# Patient Record
Sex: Female | Born: 1975 | Race: White | Hispanic: No | Smoking: Never smoker
Health system: Southern US, Community
[De-identification: ages and names within clinical notes are randomized; demographics above are authoritative.]

## PROBLEM LIST (undated history)

## (undated) DIAGNOSIS — D649 Anemia, unspecified: Secondary | ICD-10-CM

## (undated) DIAGNOSIS — F32A Depression, unspecified: Secondary | ICD-10-CM

## (undated) DIAGNOSIS — F329 Major depressive disorder, single episode, unspecified: Secondary | ICD-10-CM

## (undated) DIAGNOSIS — J45909 Unspecified asthma, uncomplicated: Secondary | ICD-10-CM

## (undated) HISTORY — PX: WISDOM TOOTH EXTRACTION: SHX21

## (undated) HISTORY — DX: Major depressive disorder, single episode, unspecified: F32.9

## (undated) HISTORY — DX: Anemia, unspecified: D64.9

## (undated) HISTORY — DX: Depression, unspecified: F32.A

## (undated) NOTE — *Deleted (*Deleted)
OBSTETRIC ADMISSION HISTORY AND PHYSICAL  [name] is a 65 y.o. female [phone] with IUP at [redacted]w[redacted]d by *** presenting for ***. She reports +FMs, No LOF, no VB, no blurry vision, headaches or peripheral edema, and RUQ pain.  She plans on *** feeding. She request *** for birth control. She received her prenatal care at {Blank single:19197::"[facility]"}   Dating: By *** --->  Estimated Date of Delivery: 04/18/20  Sono:    @***w***d, CWD, normal anatomy, *** presentation, *** lie, ***g, ***% EFW   Prenatal History/Complications: ***  Past Medical History: Past Medical History:  Diagnosis Date  . Anemia   . Asthma    childhood - no longer issue per pt  . Cholecystitis 2020  . Depression     Past Surgical History: Past Surgical History:  Procedure Laterality Date  . CHOLECYSTECTOMY N/A 10/27/2018   Procedure: LAPAROSCOPIC CHOLECYSTECTOMY;  Surgeon: Harriette Bouillon, MD;  Location: [facility];  Service: General;  Laterality: N/A;  . WISDOM TOOTH EXTRACTION      Obstetrical History: OB History    Gravida  6   Para  3   Term  3   Preterm      AB  2   Living  3     SAB      TAB  2   Ectopic      Multiple      Live Births              Social History Social History   Socioeconomic History  . Marital status: Legally Separated    Spouse name: Not on file  . Number of children: 3  . Years of education: Not on file  . Highest education level: Not on file  Occupational History  . Not on file  Tobacco Use  . Smoking status: Former Games developer  . Smokeless tobacco: Never Used  . Tobacco comment: marijuana last week  Vaping Use  . Vaping Use: Never used  Substance and Sexual Activity  . Alcohol use: Not Currently    Comment: occcasional  . Drug use: Yes    Types: Marijuana    Comment: discontinued 1 week ago  . Sexual activity: Not on file  Other Topics Concern  . Not on file  Social History Narrative  . Not on file   Social  Determinants of Health   Financial Resource Strain:   . Difficulty of Paying Living Expenses: Not on file  Food Insecurity:   . Worried About Programme researcher, broadcasting/film/video in the Last Year: Not on file  . Ran Out of Food in the Last Year: Not on file  Transportation Needs:   . Lack of Transportation (Medical): Not on file  . Lack of Transportation (Non-Medical): Not on file  Physical Activity:   . Days of Exercise per Week: Not on file  . Minutes of Exercise per Session: Not on file  Stress:   . Feeling of Stress : Not on file  Social Connections:   . Frequency of Communication with Friends and Family: Not on file  . Frequency of Social Gatherings with Friends and Family: Not on file  . Attends Religious Services: Not on file  . Active Member of Clubs or Organizations: Not on file  . Attends Banker Meetings: Not on file  . Marital Status: Not on file    Family History: Family History  Problem Relation Age of Onset  . Diabetes Maternal Grandmother   . Diabetes Paternal Grandfather   .  Hyperthyroidism Mother     Allergies: No Known Allergies  Medications Prior to Admission  Medication Sig Dispense Refill Last Dose  . acetaminophen (TYLENOL) 500 MG tablet Take 1,000 mg by mouth every 6 (six) hours as needed for moderate pain or headache.     . doxylamine, Sleep, (UNISOM) 25 MG tablet Take 25 mg by mouth at bedtime as needed for sleep.     . famotidine (ZANTAC 360) 10 MG tablet Take 10 mg by mouth daily as needed for heartburn or indigestion.     . FEROSUL 325 (65 Fe) MG tablet Take 325 mg by mouth 2 (two) times daily.     . Homeopathic Products (ZICAM ALLERGY RELIEF NA) Place 1 spray into the nose daily as needed (allergies).     . Prenatal Vit-Fe Fumarate-FA (WESTAB PLUS) 27-1 MG TABS Take 1 tablet by mouth daily.        Review of Systems   All systems reviewed and negative except as stated in HPI  Resp. rate 18, last menstrual period 07/22/2019. General  appearance: {general exam:16600} Lungs: clear to auscultation bilaterally Heart: regular rate and rhythm Abdomen: soft, non-tender; bowel sounds normal Pelvic: *** Extremities: Homans sign is negative, no sign of DVT DTR's *** Presentation: {desc; fetal presentation:14558} Fetal monitoring{findings; monitor fetal heart monitor:31527} Uterine activity{Uterine contractions:31516} Dilation: 1.5 Effacement (%): Thick Station: Ballotable Exam by:: Dr. Alvester Morin   Prenatal labs: ABO, Rh: O/Positive/-- (06/28 0000) Antibody: Negative (06/28 0000) Rubella: Immune (06/28 0000) RPR: Nonreactive (06/28 0000)  HBsAg: Negative (06/28 0000)  HIV: Non-reactive (06/28 0000)  GBS: Negative/-- (11/03 0000)  1 hr Glucola *** Genetic screening  *** Anatomy US ***  Prenatal Transfer Tool  Maternal Diabetes: {Maternal Diabetes:3043596} Genetic Screening: {Genetic Screening:20205} Maternal Ultrasounds/Referrals: {Maternal Ultrasounds / Referrals:20211} Fetal Ultrasounds or other Referrals:  {Fetal Ultrasounds or Other Referrals:20213} Maternal Substance Abuse:  {Maternal Substance Abuse:20223} Significant Maternal Medications:  {Significant Maternal Meds:20233} Significant Maternal Lab Results: {Significant Maternal Lab Results:20235}  No results found for this or any previous visit (from the past 24 hour(s)).  There are no problems to display for this patient.   Assessment/Plan:  [name] is a 41 y.o. [id number] at [redacted]w[redacted]d here for***  #Labor:*** #Pain: *** #FWB: *** #ID:  *** #MOF: *** #MOC:*** #Circ:  ***  Sheila Oats, MD  04/11/2020, 12:21 PM

---

## 2001-04-04 ENCOUNTER — Inpatient Hospital Stay (HOSPITAL_COMMUNITY): Admission: AD | Admit: 2001-04-04 | Discharge: 2001-04-04 | Payer: Self-pay | Admitting: Obstetrics & Gynecology

## 2001-04-10 ENCOUNTER — Other Ambulatory Visit: Admission: RE | Admit: 2001-04-10 | Discharge: 2001-04-10 | Payer: Self-pay | Admitting: *Deleted

## 2001-07-29 ENCOUNTER — Encounter: Payer: Self-pay | Admitting: *Deleted

## 2001-07-29 ENCOUNTER — Inpatient Hospital Stay (HOSPITAL_COMMUNITY): Admission: RE | Admit: 2001-07-29 | Discharge: 2001-07-29 | Payer: Self-pay | Admitting: *Deleted

## 2001-07-30 ENCOUNTER — Inpatient Hospital Stay (HOSPITAL_COMMUNITY): Admission: AD | Admit: 2001-07-30 | Discharge: 2001-08-01 | Payer: Self-pay

## 2002-04-05 ENCOUNTER — Emergency Department (HOSPITAL_COMMUNITY): Admission: EM | Admit: 2002-04-05 | Discharge: 2002-04-05 | Payer: Self-pay | Admitting: Emergency Medicine

## 2002-04-27 ENCOUNTER — Emergency Department (HOSPITAL_COMMUNITY): Admission: EM | Admit: 2002-04-27 | Discharge: 2002-04-27 | Payer: Self-pay | Admitting: Emergency Medicine

## 2002-05-07 ENCOUNTER — Emergency Department (HOSPITAL_COMMUNITY): Admission: EM | Admit: 2002-05-07 | Discharge: 2002-05-07 | Payer: Self-pay | Admitting: Emergency Medicine

## 2003-04-29 ENCOUNTER — Emergency Department (HOSPITAL_COMMUNITY): Admission: EM | Admit: 2003-04-29 | Discharge: 2003-04-29 | Payer: Self-pay | Admitting: Emergency Medicine

## 2003-07-11 ENCOUNTER — Ambulatory Visit (HOSPITAL_COMMUNITY): Admission: RE | Admit: 2003-07-11 | Discharge: 2003-07-11 | Payer: Self-pay | Admitting: Obstetrics & Gynecology

## 2003-08-24 ENCOUNTER — Inpatient Hospital Stay (HOSPITAL_COMMUNITY): Admission: AD | Admit: 2003-08-24 | Discharge: 2003-08-25 | Payer: Self-pay | Admitting: Obstetrics

## 2003-12-23 ENCOUNTER — Inpatient Hospital Stay (HOSPITAL_COMMUNITY): Admission: AD | Admit: 2003-12-23 | Discharge: 2003-12-27 | Payer: Self-pay | Admitting: Obstetrics & Gynecology

## 2003-12-26 ENCOUNTER — Encounter (INDEPENDENT_AMBULATORY_CARE_PROVIDER_SITE_OTHER): Payer: Self-pay | Admitting: *Deleted

## 2004-07-13 ENCOUNTER — Emergency Department (HOSPITAL_COMMUNITY): Admission: EM | Admit: 2004-07-13 | Discharge: 2004-07-13 | Payer: Self-pay | Admitting: Emergency Medicine

## 2008-10-16 ENCOUNTER — Emergency Department (HOSPITAL_COMMUNITY): Admission: EM | Admit: 2008-10-16 | Discharge: 2008-10-16 | Payer: Self-pay | Admitting: Emergency Medicine

## 2010-06-09 ENCOUNTER — Encounter: Payer: Self-pay | Admitting: Obstetrics & Gynecology

## 2010-08-28 LAB — URINALYSIS, ROUTINE W REFLEX MICROSCOPIC
Bilirubin Urine: NEGATIVE
Glucose, UA: NEGATIVE mg/dL
Hgb urine dipstick: NEGATIVE
Nitrite: NEGATIVE
Protein, ur: NEGATIVE mg/dL
Specific Gravity, Urine: 1.015 (ref 1.005–1.030)
Urobilinogen, UA: 0.2 mg/dL (ref 0.0–1.0)
pH: 6.5 (ref 5.0–8.0)

## 2010-08-28 LAB — COMPREHENSIVE METABOLIC PANEL
AST: 21 U/L (ref 0–37)
Albumin: 3.7 g/dL (ref 3.5–5.2)
BUN: 11 mg/dL (ref 6–23)
Calcium: 8.8 mg/dL (ref 8.4–10.5)
Creatinine, Ser: 0.5 mg/dL (ref 0.4–1.2)
GFR calc Af Amer: >60 mL/min (ref >60)
Total Protein: 6.4 g/dL (ref 6.0–8.3)

## 2010-08-28 LAB — CBC
HCT: 37.4 % (ref 36.0–46.0)
MCV: 88.5 fL (ref 78.0–100.0)
Platelets: 211 10*3/uL (ref 150–400)
RDW: 12.8 % (ref 11.5–15.5)
WBC: 4.9 10*3/uL (ref 4.0–10.5)

## 2010-08-28 LAB — PREGNANCY, URINE: Preg Test, Ur: NEGATIVE

## 2010-08-28 LAB — DIFFERENTIAL
Basophils Absolute: 0 10*3/uL (ref 0.0–0.1)
Eosinophils Relative: 1 % (ref 0–5)
Lymphocytes Relative: 12 % (ref 12–46)
Lymphs Abs: 0.6 10*3/uL — ABNORMAL LOW (ref 0.7–4.0)
Monocytes Absolute: 0.3 10*3/uL (ref 0.1–1.0)
Monocytes Relative: 7 % (ref 3–12)
Neutro Abs: 3.9 10*3/uL (ref 1.7–7.7)

## 2010-10-02 NOTE — Consult Note (Signed)
[name], [name] NO.:  [id number]   MEDICAL RECORD NO.:  [id number]          PATIENT TYPE:  EMS   LOCATION:  ED                           [facility]ACILITY[facility]  [facility]   PHYSICIAN:  Juanetta Gosling, MDDATE OF BIRTH:  1975/10/17   DATE OF CONSULTATION:  10/16/2008  DATE OF DISCHARGE:                                 CONSULTATION   CHIEF COMPLAINT:  Epigastric pain.   HISTORY OF PRESENT ILLNESS:  A 35 year old female with about a 1 week  history of epigastric pain, mostly occurring after she has eaten. She  does note that this is worse with spicy foods. She has not had this  evaluated before. She has attempted taking some anti-reflux medication,  over-the-counter, with some relief of this but what sounds probably like  a heartburn component as well. She still has the pain present. Last 24  to 48 hours after eating a meal, she has had worse pain causing her some  nausea as well as a couple episodes of emesis as well prior to bringing  her to the emergency room. No fevers at home. No real relieving factors.  Usually the pain will go away but this time persists.   PAST MEDICAL HISTORY:  Negative.   PAST SURGICAL HISTORY:  Negative.   MEDICATIONS:  None.   ALLERGIES:  NO KNOWN DRUG ALLERGIES.   SOCIAL HISTORY:  Occasional alcohol, non-smoker.   PHYSICAL EXAMINATION:  VITAL SIGNS: T-max is 100.2  GENERAL:  Well developed, well nourished female in no acute distress.  NECK:  Supple without adenopathy.  HEART:  Regular rate and rhythm.  LUNGS:  Clear bilaterally.  ABDOMEN:  Soft, nontender, and nondistended. Bowel sounds present with  no Murphy's sign.  EXTREMITIES:  No edema.   LABORATORY DATA:  HCG is negative. Urine shows trace ketones. Lipase 17.  BUN and creatinine are 11 and 0.5. Liver function studies are all within  normal limits. White blood cell count is 4.9. Hematocrit 37.4.   Ultrasound shows multiple gallstones and a 3 mm gallbladder wall. No  pericholecystic fluid and no Murphy's sign. Common bile duct is 5 mm.   ASSESSMENT:  Likely symptomatic cholelithiasis.   PLAN:  She is non-tender currently with examination and ultrasound  evidence of cholecystitis. I will discuss that if she tolerates p.o.'s  that she can go home and I will see her in the office next week.      Juanetta Gosling, MD  Electronically Signed     MC[facility]/MEDQ  D:  10/16/2008  T:  10/16/2008  Job:  [phone]

## 2017-01-24 ENCOUNTER — Other Ambulatory Visit: Payer: Self-pay | Admitting: Family Medicine

## 2017-01-24 DIAGNOSIS — R1084 Generalized abdominal pain: Secondary | ICD-10-CM

## 2017-01-31 DIAGNOSIS — Z23 Encounter for immunization: Secondary | ICD-10-CM | POA: Diagnosis not present

## 2017-01-31 DIAGNOSIS — R109 Unspecified abdominal pain: Secondary | ICD-10-CM | POA: Diagnosis not present

## 2017-02-03 ENCOUNTER — Ambulatory Visit
Admission: RE | Admit: 2017-02-03 | Discharge: 2017-02-03 | Disposition: A | Discharge status: Home / Self Care | Payer: 59 | Source: Ambulatory Visit | Attending: Family Medicine | Admitting: Family Medicine

## 2017-02-03 DIAGNOSIS — K8020 Calculus of gallbladder without cholecystitis without obstruction: Secondary | ICD-10-CM | POA: Diagnosis not present

## 2017-02-03 DIAGNOSIS — R1084 Generalized abdominal pain: Secondary | ICD-10-CM

## 2017-02-07 DIAGNOSIS — R109 Unspecified abdominal pain: Secondary | ICD-10-CM | POA: Diagnosis not present

## 2017-02-07 DIAGNOSIS — K219 Gastro-esophageal reflux disease without esophagitis: Secondary | ICD-10-CM | POA: Diagnosis not present

## 2018-05-20 DIAGNOSIS — K819 Cholecystitis, unspecified: Secondary | ICD-10-CM

## 2018-05-20 HISTORY — DX: Cholecystitis, unspecified: K81.9

## 2018-09-24 ENCOUNTER — Encounter: Payer: Self-pay | Admitting: Gastroenterology

## 2018-09-24 ENCOUNTER — Ambulatory Visit (INDEPENDENT_AMBULATORY_CARE_PROVIDER_SITE_OTHER): Payer: 59 | Admitting: Gastroenterology

## 2018-09-24 ENCOUNTER — Other Ambulatory Visit: Payer: Self-pay

## 2018-09-24 VITALS — Ht 59.0 in | Wt 121.0 lb

## 2018-09-24 DIAGNOSIS — R1011 Right upper quadrant pain: Secondary | ICD-10-CM

## 2018-09-24 DIAGNOSIS — R1013 Epigastric pain: Secondary | ICD-10-CM | POA: Diagnosis not present

## 2018-09-24 DIAGNOSIS — K8020 Calculus of gallbladder without cholecystitis without obstruction: Secondary | ICD-10-CM

## 2018-09-24 MED ORDER — PANTOPRAZOLE SODIUM 40 MG PO TBEC: 40.0000 mg | DELAYED_RELEASE_TABLET | Freq: Every day | ORAL | 11 refills | Status: DC

## 2018-09-24 NOTE — Progress Notes (Addendum)
TELEHEALTH VISIT  Referring Provider: Lewis Moccasinewey, Elizabeth R, MD Primary Care Physician:  Lewis Moccasinewey, Elizabeth R, MD   Tele-visit due to COVID-19 pandemic Patient requested visit virtually, consented to the virtual encounter via video enabled telemedicine application Contact made at: 3:35 09/24/18 Patient verified by name and date of birth Location of patient: Home Location provider: [facility] Names of persons participating: Me, patient, daughter, Deneise LeverDesiree Brown CMA Time spent on telehealth visit: 28 minutes I discussed the limitations of evaluation and management by telemedicine. The patient expressed understanding and agreed to proceed.  Reason for Consultation:  Abdominal pain   IMPRESSION:  Postprandial  epigastric and right upper quadrant abdominal pain    - not responding to ranitidine Cholelithiasis on ultrasound 02/03/17  Stuttering right upper quadrant/epigastric pain is concerning for symptomatic gallbladder disease.  Differential also includes esophagitis, gastritis, H. pylori, and pancreatic disease.  In the event that her symptoms are related to reflux, gastritis or esophagitis I recommended an empiric trial of pantoprazole 40 mg daily until she is seen by a surgeon.  I have asked Ms. Alycia RossettiKoch to follow-up with me after her surgical consultation to determine if additional GI evaluation is indicated.  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  History is obtained to the patient and review of her electronic health record.  She has not previously been evaluated by gastroenterologist.  Intermittent episodes of sharp, stabbing midepigastric and RUQ abdominal pains for several years. Worse recently.  Starts within 2 hours of eating. Triggered by fatty and greasy food. Eating bland food  to minimize the symptoms. Also worsened by coffee and wine.   Associated nausea.  Symptoms can last for the whole night. Achy for another 24 hours. Can last up to 2 weeks.  Exacerbated by stress. No dysphagia or odynophagia. Has lost 20 pounds over the last year. Frequent constipation. No blood in the stool.  Her husband feels that the symptoms are bad enough to call 911.   No response to ranitidine.  She has not tried other medications. No other associated symptoms. No identified exacerbating or relieving features.   Avoids all NSAIDs. No evidence for GI bleeding, iron deficiency anemia, anorexia, unexplained weight loss, dysphagia, odynophagia, persistent vomiting, or gastrointestinal cancer in a first-degree relative.  Abdominal ultrasound 02/03/17 for 3 years of abdominal pain revealed gallstones. Prescribed acid reducer without improvement.   No prior endoscopic evaluation.  No known family history of colon cancer or polyps. No family history of uterine/endometrial cancer, pancreatic cancer or gastric/stomach cancer.  Past Medical History:  Diagnosis Date  . Depression     History reviewed. No pertinent surgical history.  Current Outpatient Medications  Medication Sig Dispense Refill  . Ascorbic Acid (VITAMIN C) 100 MG tablet Take 100 mg by mouth daily.    Marland Kitchen. EVENING PRIMROSE OIL PO Take by mouth daily.    . Multiple Vitamin (MULTIVITAMIN) tablet Take 1 tablet by mouth daily.    . Vitamin D, Ergocalciferol, (DRISDOL) 1.25 MG (50000 UT) CAPS capsule Take 50,000 Units by mouth every 7 (seven) days.     No current facility-administered medications for this visit.     Allergies as of 09/24/2018  . (No Known Allergies)    Family History  Problem Relation Age of Onset  . Diabetes Maternal Grandmother   . Diabetes Paternal Grandfather  Social History   Socioeconomic History  . Marital status: Legally Separated    Spouse name: Not on file  . Number of children: Not on file   . Years of education: Not on file  . Highest education level: Not on file  Occupational History  . Not on file  Social Needs  . Financial resource strain: Not on file  . Food insecurity:    Worry: Not on file    Inability: Not on file  . Transportation needs:    Medical: Not on file    Non-medical: Not on file  Tobacco Use  . Smoking status: Never Smoker  . Smokeless tobacco: Never Used  Substance and Sexual Activity  . Alcohol use: Never    Frequency: Never  . Drug use: Yes    Types: Marijuana  . Sexual activity: Not on file  Lifestyle  . Physical activity:    Days per week: Not on file    Minutes per session: Not on file  . Stress: Not on file  Relationships  . Social connections:    Talks on phone: Not on file    Gets together: Not on file    Attends religious service: Not on file    Active member of club or organization: Not on file    Attends meetings of clubs or organizations: Not on file    Relationship status: Not on file  . Intimate partner violence:    Fear of current or ex partner: Not on file    Emotionally abused: Not on file    Physically abused: Not on file    Forced sexual activity: Not on file  Other Topics Concern  . Not on file  Social History Narrative  . Not on file    Review of Systems: ALL ROS discussed and all others negative except listed in HPI.  Physical Exam: General: in no acute distress Neuro: Alert and appropriate Psych: Normal affect and normal insight   Cherrish Vitali L. Orvan Falconer, MD, MPH [facility] 09/24/2018, 3:25 PM

## 2018-09-24 NOTE — Patient Instructions (Signed)
Do not take any more ranitidine! The FDA released a warning about the detection of low levels of NDMA, a substance associated with cancer,  in ranitidine medicines.  In place of ranitidine, please start taking pantoprazole 40 mg daily. Take this every morning 30-60 minutes before bedtime. Take it every day between now and your surgery consultation.  I am referring you to surgery to see if she or he thinks removing your gallbladder may improve your symptoms.  Please call me after your consultation.  Thank you for your patience with me and our technology today! Please stay home, safe, and healthy. I look forward to meeting you in person in the future.

## 2018-10-09 ENCOUNTER — Ambulatory Visit: Payer: Self-pay | Admitting: Surgery

## 2018-10-09 DIAGNOSIS — K8020 Calculus of gallbladder without cholecystitis without obstruction: Secondary | ICD-10-CM | POA: Diagnosis not present

## 2018-10-09 NOTE — H&P (Signed)
[name] Documented: 10/09/2018 9:56 AM Location: [facility] Patient #: [id number] DOB: 1975-10-19 Single / Language: [place] / Race: White Female  History of Present Illness Maisie Fus A. Sya Nestler MD; 10/09/2018 11:03 AM) Patient words: Patient sent at the request of Dr. Orvan Falconer with a 3 year history of epigastric abdominal pain after eating. This is been constant for over 3 years. She underwent ultrasound in 2018 which showed gallstones. Her pain is more severe after eating fatty greasy meals. She does have some mild radiation to her back during these episodes which last for minutes to hours. This occurs 2 hours after eating heavy greasy fried foods or alcohol. Location of pain is in her epigastrium. It is sharp in nature with associated nausea.  The patient is a 43 year old female.   Past Surgical History (Tanisha A. Manson Passey, RMA; 10/09/2018 9:57 AM) No pertinent past surgical history  Diagnostic Studies History (Tanisha A. Manson Passey, RMA; 10/09/2018 9:57 AM) Colonoscopy never Mammogram never Pap Smear >5 years ago  Allergies (Tanisha A. Manson Passey, RMA; 10/09/2018 9:57 AM) No Known Drug Allergies [10/09/2018]: Allergies Reconciled  Medication History (Tanisha A. Manson Passey, RMA; 10/09/2018 9:57 AM) Pantoprazole Sodium (40MG  Tablet DR, Oral) Active. Medications Reconciled  Social History (Tanisha A. Manson Passey, RMA; 10/09/2018 9:57 AM) Alcohol use Moderate alcohol use. Caffeine use Carbonated beverages, Coffee, Tea. Illicit drug use Prefer to discuss with provider. Tobacco use Never smoker.  Family History (Tanisha A. Manson Passey, RMA; 10/09/2018 9:57 AM) Diabetes Mellitus Family Members In General.  Pregnancy / Birth History (Tanisha A. Manson Passey, RMA; 10/09/2018 9:57 AM) Age at menarche 15 years. Gravida 4 Maternal age 14-20 Para 3 Regular periods  Other Problems (Tanisha A. Manson Passey, RMA; 10/09/2018 9:57 AM) Cholelithiasis Sleep Apnea     Review of Systems (Aqil Goetting A.  Carleta Woodrow MD; 10/09/2018 11:03 AM) General Not Present- Appetite Loss, Chills, Fatigue, Fever, Night Sweats, Weight Gain and Weight Loss. HEENT Not Present- Earache, Hearing Loss, Hoarseness, Nose Bleed, Oral Ulcers, Ringing in the Ears, Seasonal Allergies, Sinus Pain, Sore Throat, Visual Disturbances, Wears glasses/contact lenses and Yellow Eyes. Female Genitourinary Not Present- Frequency, Nocturia, Painful Urination, Pelvic Pain and Urgency. All other systems negative  Vitals (Tanisha A. Brown RMA; 10/09/2018 9:57 AM) 10/09/2018 9:57 AM Weight: 121.8 lb Height: 59in Body Surface Area: 1.49 m Body Mass Index: 24.6 kg/m  Temp.: 98.77F  Pulse: 85 (Regular)  BP: 112/74 (Sitting, Left Arm, Standard)      Physical Exam (Georgio Hattabaugh A. Antonela Freiman MD; 10/09/2018 11:03 AM)  General Mental Status-Alert. General Appearance-Consistent with stated age. Hydration-Well hydrated. Voice-Normal.  Head and Neck Head-normocephalic, atraumatic with no lesions or palpable masses.  Eye Eyeball - Bilateral-Extraocular movements intact. Sclera/Conjunctiva - Bilateral-No scleral icterus.  Chest and Lung Exam Chest and lung exam reveals -quiet, even and easy respiratory effort with no use of accessory muscles and on auscultation, normal breath sounds, no adventitious sounds and normal vocal resonance. Inspection Chest Wall - Normal. Back - normal.  Cardiovascular Cardiovascular examination reveals -on palpation PMI is normal in location and amplitude, no palpable S3 or S4. Normal cardiac borders., normal heart sounds, regular rate and rhythm with no murmurs, carotid auscultation reveals no bruits and normal pedal pulses bilaterally.  Abdomen Inspection Inspection of the abdomen reveals - No Hernias. Skin - Scar - no surgical scars. Palpation/Percussion Palpation and Percussion of the abdomen reveal - Soft, Non Tender, No Rebound tenderness, No Rigidity (guarding) and No  hepatosplenomegaly. Auscultation Auscultation of the abdomen reveals - Bowel sounds normal.  Neurologic  Neurologic evaluation reveals -alert and oriented x 3 with no impairment of recent or remote memory. Mental Status-Normal.  Musculoskeletal Normal Exam - Left-Upper Extremity Strength Normal and Lower Extremity Strength Normal. Normal Exam - Right-Upper Extremity Strength Normal, Lower Extremity Weakness.    Assessment & Plan (Monroe Qin A. Juliet Vasbinder MD; 10/09/2018 11:03 AM)  SYMPTOMATIC CHOLELITHIASIS (K80.20) Impression: Repeat ultrasound and LFTs. Recommend laparoscopic cholecystectomy for long-standing biliary colic. The procedure has been discussed with the patient. Risks of laparoscopic cholecystectomy include bleeding, infection, bile duct injury, leak, death, open surgery, diarrhea, other surgery, organ injury, blood vessel injury, DVT, and additional care.  Current Plans You are being scheduled for surgery- Our schedulers will call you.  You should hear from our office's scheduling department within 5 working days about the location, date, and time of surgery. We try to make accommodations for patient's preferences in scheduling surgery, but sometimes the OR schedule or the surgeon's schedule prevents us from making those accommodations.  If you have not heard from our office ([phone]) in 5 working days, call the office and ask for your surgeon's nurse.  If you have other questions about your diagnosis, plan, or surgery, call the office and ask for your surgeon's nurse.  Pt Education - Pamphlet Given - Laparoscopic Gallbladder Surgery: discussed with patient and provided information. The anatomy & physiology of hepatobiliary & pancreatic function was discussed. The pathophysiology of gallbladder dysfunction was discussed. Natural history risks without surgery was discussed. I feel the risks of no intervention will lead to serious problems that outweigh the  operative risks; therefore, I recommended cholecystectomy to remove the pathology. I explained laparoscopic techniques with possible need for an open approach. Probable cholangiogram to evaluate the bilary tract was explained as well.  Risks such as bleeding, infection, abscess, leak, injury to other organs, need for further treatment, heart attack, death, and other risks were discussed. I noted a good likelihood this will help address the problem. Possibility that this will not correct all abdominal symptoms was explained. Goals of post-operative recovery were discussed as well. We will work to minimize complications. An educational handout further explaining the pathology and treatment options was given as well. Questions were answered. The patient expresses understanding & wishes to proceed with surgery.  Pt Education - [facility] Laparosopic Post Op HCI (Gross) Pt Education - [facility] Good Bowel Health (Gross) Pt Education - Laparoscopic Cholecystectomy: gallbladde 

## 2018-10-09 NOTE — H&P (View-Only) (Signed)
[name] Documented: 10/09/2018 9:56 AM Location: [facility] Patient #: [id number] DOB: 1975-10-19 Single / Language: [place] / Race: White Female  History of Present Illness Mallory Fus A. Magnus Crescenzo MD; 10/09/2018 11:03 AM) Patient words: Patient sent at the request of Dr. Orvan Falconer with a 3 year history of epigastric abdominal pain after eating. This is been constant for over 3 years. She underwent ultrasound in 2018 which showed gallstones. Her pain is more severe after eating fatty greasy meals. She does have some mild radiation to her back during these episodes which last for minutes to hours. This occurs 2 hours after eating heavy greasy fried foods or alcohol. Location of pain is in her epigastrium. It is sharp in nature with associated nausea.  The patient is a 43 year old female.   Past Surgical History (Tanisha A. Manson Passey, RMA; 10/09/2018 9:57 AM) No pertinent past surgical history  Diagnostic Studies History (Tanisha A. Manson Passey, RMA; 10/09/2018 9:57 AM) Colonoscopy never Mammogram never Pap Smear >5 years ago  Allergies (Tanisha A. Manson Passey, RMA; 10/09/2018 9:57 AM) No Known Drug Allergies [10/09/2018]: Allergies Reconciled  Medication History (Tanisha A. Manson Passey, RMA; 10/09/2018 9:57 AM) Pantoprazole Sodium (40MG  Tablet DR, Oral) Active. Medications Reconciled  Social History (Tanisha A. Manson Passey, RMA; 10/09/2018 9:57 AM) Alcohol use Moderate alcohol use. Caffeine use Carbonated beverages, Coffee, Tea. Illicit drug use Prefer to discuss with provider. Tobacco use Never smoker.  Family History (Tanisha A. Manson Passey, RMA; 10/09/2018 9:57 AM) Diabetes Mellitus Family Members In General.  Pregnancy / Birth History (Tanisha A. Manson Passey, RMA; 10/09/2018 9:57 AM) Age at menarche 15 years. Gravida 4 Maternal age 14-20 Para 3 Regular periods  Other Problems (Tanisha A. Manson Passey, RMA; 10/09/2018 9:57 AM) Cholelithiasis Sleep Apnea     Review of Systems (Alexes Lamarque A.  Wyley Hack MD; 10/09/2018 11:03 AM) General Not Present- Appetite Loss, Chills, Fatigue, Fever, Night Sweats, Weight Gain and Weight Loss. HEENT Not Present- Earache, Hearing Loss, Hoarseness, Nose Bleed, Oral Ulcers, Ringing in the Ears, Seasonal Allergies, Sinus Pain, Sore Throat, Visual Disturbances, Wears glasses/contact lenses and Yellow Eyes. Female Genitourinary Not Present- Frequency, Nocturia, Painful Urination, Pelvic Pain and Urgency. All other systems negative  Vitals (Tanisha A. Brown RMA; 10/09/2018 9:57 AM) 10/09/2018 9:57 AM Weight: 121.8 lb Height: 59in Body Surface Area: 1.49 m Body Mass Index: 24.6 kg/m  Temp.: 98.77F  Pulse: 85 (Regular)  BP: 112/74 (Sitting, Left Arm, Standard)      Physical Exam (Sadiya Durand A. Johnpatrick Jenny MD; 10/09/2018 11:03 AM)  General Mental Status-Alert. General Appearance-Consistent with stated age. Hydration-Well hydrated. Voice-Normal.  Head and Neck Head-normocephalic, atraumatic with no lesions or palpable masses.  Eye Eyeball - Bilateral-Extraocular movements intact. Sclera/Conjunctiva - Bilateral-No scleral icterus.  Chest and Lung Exam Chest and lung exam reveals -quiet, even and easy respiratory effort with no use of accessory muscles and on auscultation, normal breath sounds, no adventitious sounds and normal vocal resonance. Inspection Chest Wall - Normal. Back - normal.  Cardiovascular Cardiovascular examination reveals -on palpation PMI is normal in location and amplitude, no palpable S3 or S4. Normal cardiac borders., normal heart sounds, regular rate and rhythm with no murmurs, carotid auscultation reveals no bruits and normal pedal pulses bilaterally.  Abdomen Inspection Inspection of the abdomen reveals - No Hernias. Skin - Scar - no surgical scars. Palpation/Percussion Palpation and Percussion of the abdomen reveal - Soft, Non Tender, No Rebound tenderness, No Rigidity (guarding) and No  hepatosplenomegaly. Auscultation Auscultation of the abdomen reveals - Bowel sounds normal.  Neurologic  Neurologic evaluation reveals -alert and oriented x 3 with no impairment of recent or remote memory. Mental Status-Normal.  Musculoskeletal Normal Exam - Left-Upper Extremity Strength Normal and Lower Extremity Strength Normal. Normal Exam - Right-Upper Extremity Strength Normal, Lower Extremity Weakness.    Assessment & Plan (Laurella Tull A. Christapher Gillian MD; 10/09/2018 11:03 AM)  SYMPTOMATIC CHOLELITHIASIS (K80.20) Impression: Repeat ultrasound and LFTs. Recommend laparoscopic cholecystectomy for long-standing biliary colic. The procedure has been discussed with the patient. Risks of laparoscopic cholecystectomy include bleeding, infection, bile duct injury, leak, death, open surgery, diarrhea, other surgery, organ injury, blood vessel injury, DVT, and additional care.  Current Plans You are being scheduled for surgery- Our schedulers will call you.  You should hear from our office's scheduling department within 5 working days about the location, date, and time of surgery. We try to make accommodations for patient's preferences in scheduling surgery, but sometimes the OR schedule or the surgeon's schedule prevents us from making those accommodations.  If you have not heard from our office [phone]) in 5 working days, call the office and ask for your surgeon's nurse.  If you have other questions about your diagnosis, plan, or surgery, call the office and ask for your surgeon's nurse.  Pt Education - Pamphlet Given - Laparoscopic Gallbladder Surgery: discussed with patient and provided information. The anatomy & physiology of hepatobiliary & pancreatic function was discussed. The pathophysiology of gallbladder dysfunction was discussed. Natural history risks without surgery was discussed. I feel the risks of no intervention will lead to serious problems that outweigh the  operative risks; therefore, I recommended cholecystectomy to remove the pathology. I explained laparoscopic techniques with possible need for an open approach. Probable cholangiogram to evaluate the bilary tract was explained as well.  Risks such as bleeding, infection, abscess, leak, injury to other organs, need for further treatment, heart attack, death, and other risks were discussed. I noted a good likelihood this will help address the problem. Possibility that this will not correct all abdominal symptoms was explained. Goals of post-operative recovery were discussed as well. We will work to minimize complications. An educational handout further explaining the pathology and treatment options was given as well. Questions were answered. The patient expresses understanding & wishes to proceed with surgery.  Pt Education - [facility] Laparosopic Post Op HCI (Gross) Pt Education - [facility] Good Bowel Health (Gross) Pt Education - Laparoscopic Cholecystectomy: gallbladde

## 2018-10-16 ENCOUNTER — Other Ambulatory Visit: Payer: Self-pay | Admitting: Surgery

## 2018-10-16 DIAGNOSIS — K8020 Calculus of gallbladder without cholecystitis without obstruction: Secondary | ICD-10-CM

## 2018-10-23 NOTE — Pre-Procedure Instructions (Signed)
[name]  10/23/2018      [id number] [place], [place] - [address] AT [facility] OF [facility] & [address] [place] [address] Phone: [phone] Fax: [phone]    Your procedure is scheduled on June 9  Report to [facility] Entrance A at 1200  (noon)  Call this number if you have problems the morning of surgery:  [phone]   Remember:  Do not eat  after midnight.   You may drink clear liquids until 11:00 A.M..  Clear liquids allowed are:                    Water, Juice (non-citric and without pulp), Carbonated beverages, Clear Tea, Black Coffee only, Plain Jell-O only, Gatorade and Plain Popsicles only    Take these medicines the morning of surgery with A SIP OF WATER :             Pantoprazole (protonix)                 7 days prior to surgery STOP taking any Aspirin (unless otherwise instructed by your surgeon), Aleve, Naproxen, Ibuprofen, Motrin, Advil, Goody's, BC's, all herbal medications, fish oil, and all vitamins.    Do not wear jewelry, make-up or nail polish.  Do not wear lotions, powders, or perfumes, or deodorant.  Do not shave 48 hours prior to surgery.  Men may shave face and neck.  Do not bring valuables to the hospital.  [facility] is not responsible for any belongings or valuables.  Contacts, dentures or bridgework may not be worn into surgery.  Leave your suitcase in the car.  After surgery it may be brought to your room.  For patients admitted to the hospital, discharge time will be determined by your treatment team.  Patients discharged the day of surgery will not be allowed to drive home.    Special instructions:   [place] [place]- Preparing For Surgery  Before surgery, you can play an important role. Because skin is not sterile, your skin needs to be as free of germs as possible. You can reduce the number of germs on your skin by washing with CHG (chlorahexidine gluconate) Soap before surgery.   CHG is an antiseptic cleaner which kills germs and bonds with the skin to continue killing germs even after washing.    Oral Hygiene is also important to reduce your risk of infection.  Remember - BRUSH YOUR TEETH THE MORNING OF SURGERY WITH YOUR REGULAR TOOTHPASTE  Please do not use if you have an allergy to CHG or antibacterial soaps. If your skin becomes reddened/irritated stop using the CHG.  Do not shave (including legs and underarms) for at least 48 hours prior to first CHG shower. It is OK to shave your face.  Please follow these instructions carefully.   1. Shower the NIGHT BEFORE SURGERY and the MORNING OF SURGERY with CHG.   2. If you chose to wash your hair, wash your hair first as usual with your normal shampoo.  3. After you shampoo, rinse your hair and body thoroughly to remove the shampoo.  4. Use CHG as you would any other liquid soap. You can apply CHG directly to the skin and wash gently with a scrungie or a clean washcloth.   5. Apply the CHG Soap to your body ONLY FROM THE NECK DOWN.  Do not use on open wounds or open sores.  Avoid contact with your eyes, ears, mouth and genitals (private parts). Wash Face and genitals (private parts)  with your normal soap.  6. Wash thoroughly, paying special attention to the area where your surgery will be performed.  7. Thoroughly rinse your body with warm water from the neck down.  8. DO NOT shower/wash with your normal soap after using and rinsing off the CHG Soap.  9. Pat yourself dry with a CLEAN TOWEL.  10. Wear CLEAN PAJAMAS to bed the night before surgery, wear comfortable clothes the morning of surgery  11. Place CLEAN SHEETS on your bed the night of your first shower and DO NOT SLEEP WITH PETS.    Day of Surgery:  Do not apply any deodorants/lotions.  Please wear clean clothes to the hospital/surgery center.   Remember to brush your teeth WITH YOUR REGULAR TOOTHPASTE.    Please read over the following fact  sheets that you were given. Coughing and Deep Breathing and Surgical Site Infection Prevention

## 2018-10-26 ENCOUNTER — Encounter (HOSPITAL_COMMUNITY): Payer: Self-pay

## 2018-10-26 ENCOUNTER — Other Ambulatory Visit: Payer: Self-pay

## 2018-10-26 ENCOUNTER — Encounter (HOSPITAL_COMMUNITY)
Admission: RE | Admit: 2018-10-26 | Discharge: 2018-10-26 | Disposition: A | Discharge status: Home / Self Care | Payer: 59 | Source: Ambulatory Visit | Attending: Surgery | Admitting: Surgery

## 2018-10-26 ENCOUNTER — Other Ambulatory Visit (HOSPITAL_COMMUNITY)
Admission: RE | Admit: 2018-10-26 | Discharge: 2018-10-26 | Disposition: A | Discharge status: Home / Self Care | Payer: 59 | Source: Ambulatory Visit | Attending: Surgery | Admitting: Surgery

## 2018-10-26 DIAGNOSIS — K8020 Calculus of gallbladder without cholecystitis without obstruction: Secondary | ICD-10-CM | POA: Diagnosis present

## 2018-10-26 DIAGNOSIS — K8010 Calculus of gallbladder with chronic cholecystitis without obstruction: Secondary | ICD-10-CM | POA: Diagnosis not present

## 2018-10-26 DIAGNOSIS — Z01812 Encounter for preprocedural laboratory examination: Secondary | ICD-10-CM | POA: Insufficient documentation

## 2018-10-26 DIAGNOSIS — K219 Gastro-esophageal reflux disease without esophagitis: Secondary | ICD-10-CM | POA: Diagnosis not present

## 2018-10-26 DIAGNOSIS — Z1159 Encounter for screening for other viral diseases: Secondary | ICD-10-CM | POA: Insufficient documentation

## 2018-10-26 LAB — CBC WITH DIFFERENTIAL/PLATELET
Abs Immature Granulocytes: 0.01 10*3/uL (ref 0.00–0.07)
Basophils Absolute: 0 10*3/uL (ref 0.0–0.1)
Basophils Relative: 1 %
Eosinophils Absolute: 0.1 10*3/uL (ref 0.0–0.5)
Eosinophils Relative: 3 %
HCT: 37.2 % (ref 36.0–46.0)
Hemoglobin: 12.1 g/dL (ref 12.0–15.0)
Immature Granulocytes: 0 %
Lymphocytes Relative: 34 %
Lymphs Abs: 1.8 10*3/uL (ref 0.7–4.0)
MCH: 30.9 pg (ref 26.0–34.0)
MCHC: 32.5 g/dL (ref 30.0–36.0)
MCV: 94.9 fL (ref 80.0–100.0)
Monocytes Absolute: 0.6 10*3/uL (ref 0.1–1.0)
Monocytes Relative: 10 %
Neutro Abs: 2.8 10*3/uL (ref 1.7–7.7)
Neutrophils Relative %: 52 %
Platelets: 312 10*3/uL (ref 150–400)
RBC: 3.92 MIL/uL (ref 3.87–5.11)
RDW: 12.7 % (ref 11.5–15.5)
WBC: 5.3 10*3/uL (ref 4.0–10.5)
nRBC: 0 % (ref 0.0–0.2)

## 2018-10-26 LAB — COMPREHENSIVE METABOLIC PANEL
ALT: 18 U/L (ref 0–44)
AST: 30 U/L (ref 15–41)
Albumin: 4.1 g/dL (ref 3.5–5.0)
Alkaline Phosphatase: 36 U/L — ABNORMAL LOW (ref 38–126)
Anion gap: 7 (ref 5–15)
BUN: 11 mg/dL (ref 6–20)
CO2: 23 mmol/L (ref 22–32)
Calcium: 9.1 mg/dL (ref 8.9–10.3)
Chloride: 111 mmol/L (ref 98–111)
Creatinine, Ser: 0.59 mg/dL (ref 0.44–1.00)
GFR calc Af Amer: >60 mL/min (ref >60)
GFR calc non Af Amer: >60 mL/min (ref >60)
Glucose, Bld: 80 mg/dL (ref 70–99)
Potassium: 4.8 mmol/L (ref 3.5–5.1)
Sodium: 141 mmol/L (ref 135–145)
Total Bilirubin: 1 mg/dL (ref 0.3–1.2)
Total Protein: 6.3 g/dL — ABNORMAL LOW (ref 6.5–8.1)

## 2018-10-26 LAB — SARS CORONAVIRUS 2 BY RT PCR (HOSPITAL ORDER, PERFORMED IN ~~LOC~~ HOSPITAL LAB): SARS Coronavirus 2: NEGATIVE

## 2018-10-26 NOTE — Progress Notes (Signed)
PCP - Dr. Tarri Glenn  Cardiologist - Denies  Chest x-ray - Denies  EKG - Denies  Stress Test - Denies  ECHO - Denies  Cardiac Cath - Denies  AICD-na PM-na LOOP-na  Sleep Study - Denies CPAP - No  LABS- 10/26/2018: CBC w/D, CMP 10/27/2018: POC UPreg  ASA- Denies  ERAS- Yes- no drink   Anesthesia- No  Pt denies having chest pain, sob, or fever at this time. All instructions explained to the pt, with a verbal understanding of the material. Pt agrees to go over the instructions while at home for a better understanding. The opportunity to ask questions was provided.   Coronavirus Screening  Have you experienced the following symptoms:  Cough yes/no: No Fever (>100.64F)  yes/no: No Runny nose yes/no: No Sore throat yes/no: No Difficulty breathing/shortness of breath  yes/no: No  Have you or a family member traveled in the last 14 days and where? yes/no: No   If the patient indicates "YES" to the above questions, their PAT will be rescheduled to limit the exposure to others and, the surgeon will be notified. THE PATIENT WILL NEED TO BE ASYMPTOMATIC FOR 14 DAYS.   If the patient is not experiencing any of these symptoms, the PAT nurse will instruct them to NOT bring anyone with them to their appointment since they may have these symptoms or traveled as well.   Please remind your patients and families that hospital visitation restrictions are in effect and the importance of the restrictions.

## 2018-10-27 ENCOUNTER — Ambulatory Visit (HOSPITAL_COMMUNITY): Payer: 59 | Admitting: Certified Registered Nurse Anesthetist

## 2018-10-27 ENCOUNTER — Ambulatory Visit (HOSPITAL_COMMUNITY)
Admission: RE | Admit: 2018-10-27 | Discharge: 2018-10-27 | Disposition: A | Discharge status: Home / Self Care | Payer: 59 | Attending: Surgery | Admitting: Surgery

## 2018-10-27 ENCOUNTER — Encounter (HOSPITAL_COMMUNITY): Payer: Self-pay

## 2018-10-27 ENCOUNTER — Other Ambulatory Visit: Payer: Self-pay

## 2018-10-27 ENCOUNTER — Encounter (HOSPITAL_COMMUNITY)
Admission: RE | Disposition: A | Discharge status: Home / Self Care | Payer: Self-pay | Source: Home / Self Care | Attending: Surgery

## 2018-10-27 DIAGNOSIS — K8010 Calculus of gallbladder with chronic cholecystitis without obstruction: Secondary | ICD-10-CM | POA: Insufficient documentation

## 2018-10-27 DIAGNOSIS — Z1159 Encounter for screening for other viral diseases: Secondary | ICD-10-CM | POA: Insufficient documentation

## 2018-10-27 DIAGNOSIS — K219 Gastro-esophageal reflux disease without esophagitis: Secondary | ICD-10-CM | POA: Insufficient documentation

## 2018-10-27 HISTORY — PX: CHOLECYSTECTOMY: SHX55

## 2018-10-27 LAB — POCT PREGNANCY, URINE: Preg Test, Ur: NEGATIVE

## 2018-10-27 SURGERY — LAPAROSCOPIC CHOLECYSTECTOMY WITH INTRAOPERATIVE CHOLANGIOGRAM
Anesthesia: General | Site: Abdomen

## 2018-10-27 MED ORDER — FENTANYL CITRATE (PF) 250 MCG/5ML IJ SOLN
INTRAMUSCULAR | Status: AC
  Filled 2018-10-27: qty 5

## 2018-10-27 MED ORDER — 0.9 % SODIUM CHLORIDE (POUR BTL) OPTIME
TOPICAL | Status: DC | PRN
  Administered 2018-10-27: 1000 mL

## 2018-10-27 MED ORDER — OXYCODONE HCL 5 MG PO TABS: 5.0000 mg | ORAL_TABLET | Freq: Four times a day (QID) | ORAL | 0 refills | Status: DC | PRN

## 2018-10-27 MED ORDER — PHENYLEPHRINE HCL (PRESSORS) 10 MG/ML IV SOLN
INTRAVENOUS | Status: DC | PRN
  Administered 2018-10-27: 80 ug via INTRAVENOUS

## 2018-10-27 MED ORDER — CHLORHEXIDINE GLUCONATE CLOTH 2 % EX PADS: 6.0000 | MEDICATED_PAD | Freq: Once | CUTANEOUS | Status: DC

## 2018-10-27 MED ORDER — DEXAMETHASONE SODIUM PHOSPHATE 10 MG/ML IJ SOLN
INTRAMUSCULAR | Status: DC | PRN
  Administered 2018-10-27: 5 mg via INTRAVENOUS

## 2018-10-27 MED ORDER — LIDOCAINE HCL (CARDIAC) PF 100 MG/5ML IV SOSY
PREFILLED_SYRINGE | INTRAVENOUS | Status: DC | PRN
  Administered 2018-10-27: 100 mg via INTRAVENOUS

## 2018-10-27 MED ORDER — ONDANSETRON HCL 4 MG/2ML IJ SOLN
INTRAMUSCULAR | Status: AC
  Filled 2018-10-27: qty 2

## 2018-10-27 MED ORDER — PROPOFOL 10 MG/ML IV BOLUS
INTRAVENOUS | Status: AC
  Filled 2018-10-27: qty 20

## 2018-10-27 MED ORDER — SUCCINYLCHOLINE CHLORIDE 20 MG/ML IJ SOLN
INTRAMUSCULAR | Status: DC | PRN
  Administered 2018-10-27: 100 mg via INTRAVENOUS

## 2018-10-27 MED ORDER — MIDAZOLAM HCL 2 MG/2ML IJ SOLN
INTRAMUSCULAR | Status: AC
  Filled 2018-10-27: qty 2

## 2018-10-27 MED ORDER — PROPOFOL 10 MG/ML IV BOLUS
INTRAVENOUS | Status: DC | PRN
  Administered 2018-10-27: 150 mg via INTRAVENOUS

## 2018-10-27 MED ORDER — SODIUM CHLORIDE 0.9 % IR SOLN
Status: DC | PRN
  Administered 2018-10-27: 1000 mL

## 2018-10-27 MED ORDER — CEFAZOLIN SODIUM-DEXTROSE 2-4 GM/100ML-% IV SOLN
2.0000 g | INTRAVENOUS | Status: AC
  Administered 2018-10-27: 2 g via INTRAVENOUS
  Filled 2018-10-27: qty 100

## 2018-10-27 MED ORDER — HYDROMORPHONE HCL 1 MG/ML IJ SOLN
0.2500 mg | INTRAMUSCULAR | Status: DC | PRN
  Administered 2018-10-27: 0.5 mg via INTRAVENOUS

## 2018-10-27 MED ORDER — MIDAZOLAM HCL 5 MG/5ML IJ SOLN
INTRAMUSCULAR | Status: DC | PRN
  Administered 2018-10-27: 2 mg via INTRAVENOUS

## 2018-10-27 MED ORDER — EPHEDRINE 5 MG/ML INJ
INTRAVENOUS | Status: AC
  Filled 2018-10-27: qty 30

## 2018-10-27 MED ORDER — FENTANYL CITRATE (PF) 100 MCG/2ML IJ SOLN
INTRAMUSCULAR | Status: DC | PRN
  Administered 2018-10-27 (×2): 100 ug via INTRAVENOUS
  Administered 2018-10-27: 50 ug via INTRAVENOUS

## 2018-10-27 MED ORDER — BUPIVACAINE-EPINEPHRINE 0.25% -1:200000 IJ SOLN
INTRAMUSCULAR | Status: DC | PRN
  Administered 2018-10-27: 10 mL

## 2018-10-27 MED ORDER — ONDANSETRON HCL 4 MG/2ML IJ SOLN
INTRAMUSCULAR | Status: DC | PRN
  Administered 2018-10-27: 4 mg via INTRAVENOUS

## 2018-10-27 MED ORDER — CELECOXIB 200 MG PO CAPS
200.0000 mg | ORAL_CAPSULE | ORAL | Status: AC
  Administered 2018-10-27: 200 mg via ORAL
  Filled 2018-10-27: qty 1

## 2018-10-27 MED ORDER — PHENYLEPHRINE 40 MCG/ML (10ML) SYRINGE FOR IV PUSH (FOR BLOOD PRESSURE SUPPORT)
PREFILLED_SYRINGE | INTRAVENOUS | Status: AC
  Filled 2018-10-27: qty 20

## 2018-10-27 MED ORDER — LACTATED RINGERS IV SOLN
INTRAVENOUS | Status: DC
  Administered 2018-10-27: 13:00:00 via INTRAVENOUS

## 2018-10-27 MED ORDER — OXYCODONE HCL 5 MG PO TABS
ORAL_TABLET | ORAL | Status: AC
  Administered 2018-10-27: 5 mg
  Filled 2018-10-27: qty 1

## 2018-10-27 MED ORDER — SUCCINYLCHOLINE CHLORIDE 200 MG/10ML IV SOSY
PREFILLED_SYRINGE | INTRAVENOUS | Status: AC
  Filled 2018-10-27: qty 10

## 2018-10-27 MED ORDER — ROCURONIUM BROMIDE 100 MG/10ML IV SOLN
INTRAVENOUS | Status: DC | PRN
  Administered 2018-10-27: 50 mg via INTRAVENOUS

## 2018-10-27 MED ORDER — IBUPROFEN 800 MG PO TABS: 800.0000 mg | ORAL_TABLET | Freq: Three times a day (TID) | ORAL | 0 refills | Status: DC | PRN

## 2018-10-27 MED ORDER — MEPERIDINE HCL 25 MG/ML IJ SOLN: 6.2500 mg | INTRAMUSCULAR | Status: DC | PRN

## 2018-10-27 MED ORDER — ROCURONIUM BROMIDE 10 MG/ML (PF) SYRINGE
PREFILLED_SYRINGE | INTRAVENOUS | Status: AC
  Filled 2018-10-27: qty 10

## 2018-10-27 MED ORDER — PROMETHAZINE HCL 25 MG/ML IJ SOLN: 6.2500 mg | INTRAMUSCULAR | Status: DC | PRN

## 2018-10-27 MED ORDER — ACETAMINOPHEN 500 MG PO TABS
1000.0000 mg | ORAL_TABLET | ORAL | Status: AC
  Administered 2018-10-27: 13:00:00 1000 mg via ORAL
  Filled 2018-10-27: qty 2

## 2018-10-27 MED ORDER — EPHEDRINE SULFATE 50 MG/ML IJ SOLN
INTRAMUSCULAR | Status: DC | PRN
  Administered 2018-10-27: 10 mg via INTRAVENOUS

## 2018-10-27 MED ORDER — GABAPENTIN 300 MG PO CAPS
300.0000 mg | ORAL_CAPSULE | ORAL | Status: AC
  Administered 2018-10-27: 300 mg via ORAL
  Filled 2018-10-27: qty 1

## 2018-10-27 MED ORDER — SUGAMMADEX SODIUM 200 MG/2ML IV SOLN
INTRAVENOUS | Status: DC | PRN
  Administered 2018-10-27: 250 mg via INTRAVENOUS

## 2018-10-27 MED ORDER — KETOROLAC TROMETHAMINE 30 MG/ML IJ SOLN
INTRAMUSCULAR | Status: AC
  Filled 2018-10-27: qty 1

## 2018-10-27 MED ORDER — LIDOCAINE 2% (20 MG/ML) 5 ML SYRINGE
INTRAMUSCULAR | Status: AC
  Filled 2018-10-27: qty 5

## 2018-10-27 MED ORDER — DEXAMETHASONE SODIUM PHOSPHATE 10 MG/ML IJ SOLN
INTRAMUSCULAR | Status: AC
  Filled 2018-10-27: qty 4

## 2018-10-27 MED ORDER — HYDROMORPHONE HCL 1 MG/ML IJ SOLN
INTRAMUSCULAR | Status: AC
  Filled 2018-10-27: qty 1

## 2018-10-27 MED ORDER — SUGAMMADEX SODIUM 500 MG/5ML IV SOLN
INTRAVENOUS | Status: AC
  Filled 2018-10-27: qty 5

## 2018-10-27 MED ORDER — BUPIVACAINE-EPINEPHRINE (PF) 0.25% -1:200000 IJ SOLN
INTRAMUSCULAR | Status: AC
  Filled 2018-10-27: qty 30

## 2018-10-27 MED ORDER — KETOROLAC TROMETHAMINE 30 MG/ML IJ SOLN
30.0000 mg | Freq: Once | INTRAMUSCULAR | Status: AC | PRN
  Administered 2018-10-27: 16:00:00 30 mg via INTRAVENOUS

## 2018-10-27 SURGICAL SUPPLY — 44 items
ADH SKN CLS APL DERMABOND .7 (GAUZE/BANDAGES/DRESSINGS) ×1
APPLIER CLIP ROT 10 11.4 M/L (STAPLE) ×3
APR CLP MED LRG 11.4X10 (STAPLE) ×1
BAG SPEC RTRVL 10 TROC 200 (ENDOMECHANICALS) ×1
BLADE CLIPPER SURG (BLADE) IMPLANT
CANISTER SUCT 3000ML PPV (MISCELLANEOUS) ×3 IMPLANT
CHLORAPREP W/TINT 26ML (MISCELLANEOUS) ×3 IMPLANT
CLIP APPLIE ROT 10 11.4 M/L (STAPLE) ×1 IMPLANT
COVER MAYO STAND STRL (DRAPES) ×3 IMPLANT
COVER SURGICAL LIGHT HANDLE (MISCELLANEOUS) ×3 IMPLANT
COVER WAND RF STERILE (DRAPES) ×3 IMPLANT
DERMABOND ADVANCED (GAUZE/BANDAGES/DRESSINGS) ×2
DERMABOND ADVANCED .7 DNX12 (GAUZE/BANDAGES/DRESSINGS) ×1 IMPLANT
DRAPE C-ARM 42X72 X-RAY (DRAPES) ×3 IMPLANT
DRAPE WARM FLUID 44X44 (DRAPES) ×3 IMPLANT
ELECT REM PT RETURN 9FT ADLT (ELECTROSURGICAL) ×3
ELECTRODE REM PT RTRN 9FT ADLT (ELECTROSURGICAL) ×1 IMPLANT
GLOVE BIO SURGEON STRL SZ8 (GLOVE) ×3 IMPLANT
GLOVE BIOGEL PI IND STRL 8 (GLOVE) ×1 IMPLANT
GLOVE BIOGEL PI INDICATOR 8 (GLOVE) ×2
GOWN STRL REUS W/ TWL LRG LVL3 (GOWN DISPOSABLE) ×2 IMPLANT
GOWN STRL REUS W/ TWL XL LVL3 (GOWN DISPOSABLE) ×1 IMPLANT
GOWN STRL REUS W/TWL LRG LVL3 (GOWN DISPOSABLE) ×6
GOWN STRL REUS W/TWL XL LVL3 (GOWN DISPOSABLE) ×3
KIT BASIN OR (CUSTOM PROCEDURE TRAY) ×3 IMPLANT
KIT TURNOVER KIT B (KITS) ×3 IMPLANT
NS IRRIG 1000ML POUR BTL (IV SOLUTION) ×3 IMPLANT
PAD ARMBOARD 7.5X6 YLW CONV (MISCELLANEOUS) ×3 IMPLANT
POUCH RETRIEVAL ECOSAC 10 (ENDOMECHANICALS) ×1 IMPLANT
POUCH RETRIEVAL ECOSAC 10MM (ENDOMECHANICALS) ×2
SCISSORS LAP 5X35 DISP (ENDOMECHANICALS) ×3 IMPLANT
SET CHOLANGIOGRAPH 5 50 .035 (SET/KITS/TRAYS/PACK) ×3 IMPLANT
SET IRRIG TUBING LAPAROSCOPIC (IRRIGATION / IRRIGATOR) ×3 IMPLANT
SET TUBE SMOKE EVAC HIGH FLOW (TUBING) ×3 IMPLANT
SLEEVE ENDOPATH XCEL 5M (ENDOMECHANICALS) ×3 IMPLANT
SPECIMEN JAR SMALL (MISCELLANEOUS) ×3 IMPLANT
SUT MNCRL AB 4-0 PS2 18 (SUTURE) ×3 IMPLANT
TOWEL OR 17X24 6PK STRL BLUE (TOWEL DISPOSABLE) ×3 IMPLANT
TOWEL OR 17X26 10 PK STRL BLUE (TOWEL DISPOSABLE) ×3 IMPLANT
TRAY LAPAROSCOPIC MC (CUSTOM PROCEDURE TRAY) ×3 IMPLANT
TROCAR XCEL BLUNT TIP 100MML (ENDOMECHANICALS) ×3 IMPLANT
TROCAR XCEL NON-BLD 11X100MML (ENDOMECHANICALS) ×3 IMPLANT
TROCAR XCEL NON-BLD 5MMX100MML (ENDOMECHANICALS) ×3 IMPLANT
WATER STERILE IRR 1000ML POUR (IV SOLUTION) ×3 IMPLANT

## 2018-10-27 NOTE — Anesthesia Preprocedure Evaluation (Signed)
Anesthesia Evaluation  Patient identified by MRN, date of birth, ID band Patient awake    Reviewed: Allergy & Precautions, H&P , NPO status   Airway Mallampati: I       Dental no notable dental hx. (+) Teeth Intact   Pulmonary neg pulmonary ROS,    Pulmonary exam normal breath sounds clear to auscultation       Cardiovascular negative cardio ROS Normal cardiovascular exam Rhythm:Regular Rate:Normal     Neuro/Psych PSYCHIATRIC DISORDERS Depression negative neurological ROS     GI/Hepatic Neg liver ROS, GERD  Medicated and Controlled,  Endo/Other  negative endocrine ROS  Renal/GU negative Renal ROS  negative genitourinary   Musculoskeletal negative musculoskeletal ROS (+)   Abdominal Normal abdominal exam  (+)   Peds  Hematology negative hematology ROS (+)   Anesthesia Other Findings   Reproductive/Obstetrics                             Anesthesia Physical Anesthesia Plan  ASA: II  Anesthesia Plan: General   Post-op Pain Management:    Induction: Intravenous  PONV Risk Score and Plan: 4 or greater and Ondansetron, Dexamethasone, Scopolamine patch - Pre-op and Midazolam  Airway Management Planned: Oral ETT  Additional Equipment:   Intra-op Plan:   Post-operative Plan: Extubation in OR  Informed Consent: I have reviewed the patients History and Physical, chart, labs and discussed the procedure including the risks, benefits and alternatives for the proposed anesthesia with the patient or authorized representative who has indicated his/her understanding and acceptance.     Dental advisory given  Plan Discussed with: CRNA  Anesthesia Plan Comments:         Anesthesia Quick Evaluation

## 2018-10-27 NOTE — Interval H&P Note (Signed)
History and Physical Interval Note:  10/27/2018 1:40 PM  [name]  has presented today for surgery, with the diagnosis of GALLSTONES.  The various methods of treatment have been discussed with the patient and family. After consideration of risks, benefits and other options for treatment, the patient has consented to  Procedure(s): LAPAROSCOPIC CHOLECYSTECTOMY WITH INTRAOPERATIVE CHOLANGIOGRAM (N/A) as a surgical intervention.  The patient's history has been reviewed, patient examined, no change in status, stable for surgery.  I have reviewed the patient's chart and labs.  Questions were answered to the patient's satisfaction.     [place]

## 2018-10-27 NOTE — Op Note (Signed)
Laparoscopic Cholecystectomy with IOC Procedure Note  Indications: This patient presents with symptomatic gallbladder disease and will undergo laparoscopic cholecystectomy.  The procedure has been discussed with the patient. Operative and non operative treatments have been discussed. Risks of surgery include bleeding, infection,  Common bile duct injury,  Injury to the stomach,liver, colon,small intestine, abdominal wall,  Diaphragm,  Major blood vessels,  And the need for an open procedure.  Other risks include worsening of medical problems, death,  DVT and pulmonary embolism, and cardiovascular events.   Medical options have also been discussed. The patient has been informed of long term expectations of surgery and non surgical options,  The patient agrees to proceed.    Pre-operative Diagnosis: Calculus of gallbladder without mention of cholecystitis or obstruction  Post-operative Diagnosis: Calculus of gallbladder without mention of cholecystitis or obstruction  Surgeon: Dortha Schwalbehomas A Djuana Littleton  MD   Assistants: Orson SlickBowman RNFA   Anesthesia: General endotracheal anesthesia and Local anesthesia 0.25.% bupivacaine, with epinephrine  ASA Class: 1  Procedure Details  The patient was seen again in the Holding Room. The risks, benefits, complications, treatment options, and expected outcomes were discussed with the patient. The possibilities of reaction to medication, pulmonary aspiration, perforation of viscus, bleeding, recurrent infection, finding a normal gallbladder, the need for additional procedures, failure to diagnose a condition, the possible need to convert to an open procedure, and creating a complication requiring transfusion or operation were discussed with the patient. The patient and/or family concurred with the proposed plan, giving informed consent. The site of surgery properly noted/marked. The patient was taken to Operating Room, identified as [name] and the procedure verified as  Laparoscopic Cholecystectomy with Intraoperative Cholangiograms. A Time Out was held and the above information confirmed.  Prior to the induction of general anesthesia, antibiotic prophylaxis was administered. General endotracheal anesthesia was then administered and tolerated well. After the induction, the abdomen was prepped in the usual sterile fashion. The patient was positioned in the supine position with the left arm comfortably tucked, along with some reverse Trendelenburg.  Local anesthetic agent was injected into the skin near the umbilicus and an incision made. The midline fascia was incised and the Hasson technique was used to introduce a 12 mm port under direct vision. It was secured with a figure of eight Vicryl suture placed in the usual fashion. Pneumoperitoneum was then created with CO2 and tolerated well without any adverse changes in the patient's vital signs. Additional trocars were introduced under direct vision with an 11 mm trocar in the epigastrium and two  5 mm trocars in the right upper quadrant. All skin incisions were infiltrated with a local anesthetic agent before making the incision and placing the trocars.   The gallbladder was identified, the fundus grasped and retracted cephalad. Adhesions were lysed bluntly and with the electrocautery where indicated, taking care not to injure any adjacent organs or viscus. The infundibulum was grasped and retracted laterally, exposing the peritoneum overlying the triangle of Calot. This was then divided and exposed in a blunt fashion. The cystic duct was clearly identified and bluntly dissected circumferentially. The junctions of the gallbladder, cystic duct and common bile duct were clearly identified prior to the division of any linear structure.  The cystic duct was small,  The critical view obtained and the CBD clearly seen.  Cholangiogram was not  performed.   The cystic duct was clipped and divided.  The cystic artery was clipped and  divided.    The gallbladder was  dissected from the liver bed in retrograde fashion with the electrocautery. The gallbladder was removed. The liver bed was irrigated and inspected. Hemostasis was achieved with the electrocautery. Copious irrigation was utilized and was repeatedly aspirated until clear all particulate matter. Hemostasis was achieved with no signs  Of bleeding or bile leakage.  Pneumoperitoneum was completely reduced after viewing removal of the trocars under direct vision. The wound was thoroughly irrigated and the fascia was then closed with a figure of eight suture; the skin was then closed with 4 O monocryl and a sterile dressing was applied.  Instrument, sponge, and needle counts were correct at closure and at the conclusion of the case.   Findings:   Cholelithiasis  Estimated Blood Loss: Minimal         Drains: none         Total IV Fluids: Per record          Specimens: Gallbladder           Complications: None; patient tolerated the procedure well.         Disposition: PACU - hemodynamically stable.         Condition: stable

## 2018-10-27 NOTE — Progress Notes (Signed)
Persistent nause/vomited greenish bile x 4 / placed #24 sl r ac/ given 4mg  zofran iv and sl removed

## 2018-10-27 NOTE — Anesthesia Procedure Notes (Signed)
Procedure Name: Intubation Date/Time: 10/27/2018 2:21 PM Performed by: Abdikadir Fohl T, CRNA Pre-anesthesia Checklist: Patient identified, Emergency Drugs available, Suction available and Patient being monitored Patient Re-evaluated:Patient Re-evaluated prior to induction Oxygen Delivery Method: Circle system utilized Preoxygenation: Pre-oxygenation with 100% oxygen Induction Type: IV induction Laryngoscope Size: Miller and 2 Grade View: Grade I Tube type: Oral Tube size: 7.5 mm Number of attempts: 1 Airway Equipment and Method: Patient positioned with wedge pillow and Stylet Placement Confirmation: ETT inserted through vocal cords under direct vision,  positive ETCO2 and breath sounds checked- equal and bilateral Secured at: 21 cm Tube secured with: Tape Dental Injury: Teeth and Oropharynx as per pre-operative assessment

## 2018-10-27 NOTE — Transfer of Care (Signed)
Immediate Anesthesia Transfer of Care Note  Patient: [name]  Procedure(s) Performed: LAPAROSCOPIC CHOLECYSTECTOMY (N/A Abdomen)  Patient Location: PACU  Anesthesia Type:General  Level of Consciousness: drowsy  Airway & Oxygen Therapy: Patient Spontanous Breathing and Patient connected to nasal cannula oxygen  Post-op Assessment: Report given to RN, Post -op Vital signs reviewed and stable and Patient moving all extremities  Post vital signs: Reviewed and stable  Last Vitals:  Vitals Value Taken Time  BP 118/64 10/27/2018  3:26 PM  Temp 36.4 C 10/27/2018  3:25 PM  Pulse 70 10/27/2018  3:33 PM  Resp 24 10/27/2018  3:33 PM  SpO2 100 % 10/27/2018  3:33 PM  Vitals shown include unvalidated device data.  Last Pain:  Vitals:   10/27/18 1525  TempSrc:   PainSc: 2          Complications: No apparent anesthesia complications

## 2018-10-27 NOTE — Discharge Instructions (Signed)
[facility] _______[facility] ° °UMBILICAL OR INGUINAL HERNIA REPAIR: POST OP INSTRUCTIONS ° °Always review your discharge instruction sheet given to you by the facility where your surgery was performed. °IF YOU HAVE DISABILITY OR FAMILY LEAVE FORMS, YOU MUST BRING THEM TO THE OFFICE FOR PROCESSING.   °DO NOT GIVE THEM TO YOUR DOCTOR. ° °1. A  prescription for pain medication may be given to you upon discharge.  Take your pain medication as prescribed, if needed.  If narcotic pain medicine is not needed, then you may take acetaminophen (Tylenol) or ibuprofen (Advil) as needed. °2. Take your usually prescribed medications unless otherwise directed. °If you need a refill on your pain medication, please contact your pharmacy.  They will contact our office to request authorization. Prescriptions will not be filled after 5 pm or on week-ends. °3. You should follow a light diet the first 24 hours after arrival home, such as soup and crackers, etc.  Be sure to include lots of fluids daily.  Resume your normal diet the day after surgery. °4.Most patients will experience some swelling and bruising around the umbilicus or in the groin and scrotum.  Ice packs and reclining will help.  Swelling and bruising can take several days to resolve.  °6. It is common to experience some constipation if taking pain medication after surgery.  Increasing fluid intake and taking a stool softener (such as Colace) will usually help or prevent this problem from occurring.  A mild laxative (Milk of Magnesia or Miralax) should be taken according to package directions if there are no bowel movements after 48 hours. °7. Unless discharge instructions indicate otherwise, you may remove your bandages 24-48 hours after surgery, and you may shower at that time.  You may have steri-strips (small skin tapes) in place directly over the incision.  These strips should be left on the skin for 7-10 days.  If your surgeon used skin glue on the  incision, you may shower in 24 hours.  The glue will flake off over the next 2-3 weeks.  Any sutures or staples will be removed at the office during your follow-up visit. °8. ACTIVITIES:  You may resume regular (light) daily activities beginning the next day--such as daily self-care, walking, climbing stairs--gradually increasing activities as tolerated.  You may have sexual intercourse when it is comfortable.  Refrain from any heavy lifting or straining until approved by your doctor. ° °a.You may drive when you are no longer taking prescription pain medication, you can comfortably wear a seatbelt, and you can safely maneuver your car and apply brakes. °b.RETURN TO WORK:   °_____________________________________________ ° °9.You should see your doctor in the office for a follow-up appointment approximately 2-3 weeks after your surgery.  Make sure that you call for this appointment within a day or two after you arrive home to insure a convenient appointment time. °10.OTHER INSTRUCTIONS: _________________________ °   _____________________________________ ° °WHEN TO CALL YOUR DOCTOR: °1. Fever over 101.0 °2. Inability to urinate °3. Nausea and/or vomiting °4. Extreme swelling or bruising °5. Continued bleeding from incision. °6. Increased pain, redness, or drainage from the incision ° °The clinic staff is available to answer your questions during regular business hours.  Please don’t hesitate to call and ask to speak to one of the nurses for clinical concerns.  If you have a medical emergency, go to the nearest emergency room or call 911.  A surgeon from [facility] is always on call at the hospital ° ° °  [address], [address], [place], [place]  [address] ? ° [address] [address], [place], [place]   [address] °[phone] ? [phone] ? FAX [phone] °Web site: [email or web address] °

## 2018-10-28 ENCOUNTER — Encounter (HOSPITAL_COMMUNITY): Payer: Self-pay | Admitting: Surgery

## 2018-10-29 ENCOUNTER — Ambulatory Visit
Admission: RE | Admit: 2018-10-29 | Discharge: 2018-10-29 | Disposition: A | Discharge status: Home / Self Care | Payer: 59 | Source: Ambulatory Visit | Attending: Surgery | Admitting: Surgery

## 2018-10-29 DIAGNOSIS — K8020 Calculus of gallbladder without cholecystitis without obstruction: Secondary | ICD-10-CM

## 2018-11-03 NOTE — Anesthesia Postprocedure Evaluation (Signed)
Anesthesia Post Note  Patient: [name]  Procedure(s) Performed: LAPAROSCOPIC CHOLECYSTECTOMY (N/A Abdomen)     Patient location during evaluation: PACU Anesthesia Type: General Level of consciousness: awake and alert Pain management: pain level controlled Vital Signs Assessment: post-procedure vital signs reviewed and stable Respiratory status: spontaneous breathing, nonlabored ventilation, respiratory function stable and patient connected to nasal cannula oxygen Cardiovascular status: blood pressure returned to baseline and stable Postop Assessment: no apparent nausea or vomiting Anesthetic complications: no    Last Vitals:  Vitals:   10/27/18 1530 10/27/18 1538  BP:    Pulse: 72 65  Resp: 19 13  Temp:    SpO2: 100% 100%    Last Pain:  Vitals:   10/27/18 1547  TempSrc:   PainSc: 6                  Sanad Fearnow

## 2018-11-12 ENCOUNTER — Ambulatory Visit (INDEPENDENT_AMBULATORY_CARE_PROVIDER_SITE_OTHER): Payer: 59 | Admitting: Family Medicine

## 2018-11-12 ENCOUNTER — Encounter: Payer: Self-pay | Admitting: Family Medicine

## 2018-11-12 VITALS — Ht 60.0 in | Wt 120.0 lb

## 2018-11-12 DIAGNOSIS — E559 Vitamin D deficiency, unspecified: Secondary | ICD-10-CM | POA: Insufficient documentation

## 2018-11-12 DIAGNOSIS — Z7689 Persons encountering health services in other specified circumstances: Secondary | ICD-10-CM

## 2018-11-12 DIAGNOSIS — Z1239 Encounter for other screening for malignant neoplasm of breast: Secondary | ICD-10-CM | POA: Diagnosis not present

## 2018-11-12 DIAGNOSIS — Z3009 Encounter for other general counseling and advice on contraception: Secondary | ICD-10-CM | POA: Diagnosis not present

## 2018-11-12 NOTE — Progress Notes (Signed)
Virtual Visit via Video Note  I connected with [name] on 11/12/18 at 10:30 AM EDT by a video enabled telemedicine application and verified that I am speaking with the correct person using two identifiers. Location patient: home Location provider: work  Persons participating in the virtual visit: patient, provider  I discussed the limitations of evaluation and management by telemedicine and the availability of in person appointments. The patient expressed understanding and agreed to proceed.  Chief Complaint  Patient presents with  . Establish Care    Est care     HPI: [name] is a 43 y.o. female to establish care with our office. She was referred to our office by her GI doctor Dr. Thornton Park. She has not had a primary in years. She has 3 children.  She is due for a mammo, PAP, fasting labs.  She would like to discuss birth control options. She has never been on birth control and wants to understand options. No personal or family h/o GYN cancers or breast cancer. She does not smoke. She is not obese.   Past Medical History:  Diagnosis Date  . Depression     Past Surgical History:  Procedure Laterality Date  . CHOLECYSTECTOMY N/A 10/27/2018   Procedure: LAPAROSCOPIC CHOLECYSTECTOMY;  Surgeon: Erroll Luna, MD;  Location: [facility] OR;  Service: General;  Laterality: N/A;  . WISDOM TOOTH EXTRACTION      Family History  Problem Relation Age of Onset  . Diabetes Maternal Grandmother   . Diabetes Paternal Grandfather     Social History   Tobacco Use  . Smoking status: Never Smoker  . Smokeless tobacco: Never Used  Substance Use Topics  . Alcohol use: Yes    Frequency: Never    Comment: occcasional  . Drug use: Yes    Types: Marijuana    Comment: occasional     Current Outpatient Medications:  .  Ascorbic Acid (VITAMIN C) 100 MG tablet, Take 100 mg by mouth daily., Disp: , Rfl:  .  cholecalciferol (VITAMIN D3) 25 MCG (1000 UT) tablet, Take 1,000 Units by  mouth daily., Disp: , Rfl:  .  Multiple Vitamin (MULTIVITAMIN) tablet, Take 1 tablet by mouth daily., Disp: , Rfl:  .  EVENING PRIMROSE OIL PO, Take 1 capsule by mouth daily. , Disp: , Rfl:   No Known Allergies    ROS: See pertinent positives and negatives per HPI.   EXAM:  VITALS per patient if applicable: Ht 5' (1.245 m)   Wt 120 lb (54.4 kg)   LMP 10/25/2018   BMI 23.44 kg/m    GENERAL: alert, oriented, appears well and in no acute distress  HEENT: atraumatic, conjunctiva clear, no obvious abnormalities on inspection of external nose and ears  NECK: normal movements of the head and neck  LUNGS: on inspection no signs of respiratory distress, breathing rate appears normal, no obvious gross SOB, gasping or wheezing, no conversational dyspnea  CV: no obvious cyanosis  PSYCH/NEURO: pleasant and cooperative, speech and thought processing grossly intact   ASSESSMENT AND PLAN:  1. Encounter to establish care with new doctor - pt will schedule appt for CPE, fasting labs, PAP  2. Birth control counseling - discussed birth control options including OCP, IUD, depo, and nexplanon. Pt will consider options and will let me know her thoughts at appt for CPE/PAP. All questions answered  3. Screening for breast cancer - MM DIGITAL SCREENING BILATERAL; Future     I discussed the assessment and treatment  plan with the patient. The patient was provided an opportunity to ask questions and all were answered. The patient agreed with the plan and demonstrated an understanding of the instructions.   The patient was advised to call back or seek an in-person evaluation if the symptoms worsen or if the condition fails to improve as anticipated.   Letta Median, DO

## 2019-05-21 NOTE — L&D Delivery Note (Signed)
[name] is a 44 y.o.@ s/p vaginal delivery at [redacted]w[redacted]d.  She was admitted for labor in the setting eIOL.    ROM: 8h 45m with clear fluid GBS Status: negative  Maximum Maternal Temperature: 99.82F   Labor Progress: Pt in latent labor on admission.  She continued to progress well without augmentation. Given pt's desire to augment, AROM was performed for light meconium stained fluid. Pitocin was also initiated and pt then was noted to have complete cervical dilation. She then delivered without complication as noted below.   Delivery Date/Time: 04/13/2020 at [phone] Delivery: Called to room and patient was complete and pushing. Head delivered ROA. No nuchal cord present. Shoulder and body delivered in usual fashion. Infant with spontaneous cry, placed on mother's abdomen, dried and stimulated. Cord clamped x 2 after 1-minute delay, and cut by father of baby under my direct supervision. Cord blood drawn. Placenta delivered spontaneously with gentle cord traction. Fundus firm with massage and Pitocin. Labia, perineum, vagina, and cervix were inspected; bilateral periurethral abrasions visualized without surgical repair required, silver nitrate used.    Placenta: intact, 3-vessel cord, sent to L&D Complications: knot present within cord Lacerations: bilateral periurethral abrasions, no surgical repair required  EBL: 200 ml Analgesia: epidural   Infant: female  APGARs 8 & 9  weight per medical record  Ramiyah Mcclenahan, DO

## 2019-10-07 ENCOUNTER — Ambulatory Visit: Payer: Self-pay | Attending: Internal Medicine | Admitting: Internal Medicine

## 2019-10-07 ENCOUNTER — Encounter: Payer: Self-pay | Admitting: Internal Medicine

## 2019-10-07 ENCOUNTER — Other Ambulatory Visit: Payer: Self-pay

## 2019-10-07 VITALS — BP 110/70 | HR 76 | Temp 98.4°F | Resp 16 | Ht 60.0 in | Wt 140.8 lb

## 2019-10-07 DIAGNOSIS — Z3201 Encounter for pregnancy test, result positive: Secondary | ICD-10-CM

## 2019-10-07 LAB — POCT URINE PREGNANCY: Preg Test, Ur: POSITIVE — AB

## 2019-10-07 NOTE — Progress Notes (Signed)
Patient ID: [name], female    DOB: 02/03/1976  MRN: [id number]  CC: New Patient (Initial Visit) and Referral   Subjective: [name] is a 44 y.o. female who presents for new pt visit Her concerns today include:    No previous PCP.  She denies any chronic medical issues. Meds:  Prenatal vit  Home pregnancy test positive end of March.  Last menses was at the beginning of March No vaginal bleeding or dischg. Little nausea. Some lower abdomen cramps at times especially with prolonged standing at work.  She has had 3 successful pregnancies in the past.. Does not smoke or drink ETOH.   No street drugs. She is here today to request referral for prenatal care.  She is uninsured.   Past medical, social, family history and surgical history reviewed and updated.  Current Outpatient Medications on File Prior to Visit  Medication Sig Dispense Refill  . Ascorbic Acid (VITAMIN C) 100 MG tablet Take 100 mg by mouth daily.    . cholecalciferol (VITAMIN D3) 25 MCG (1000 UT) tablet Take 1,000 Units by mouth daily.    Marland Kitchen EVENING PRIMROSE OIL PO Take 1 capsule by mouth daily.     . Multiple Vitamin (MULTIVITAMIN) tablet Take 1 tablet by mouth daily.     No current facility-administered medications on file prior to visit.    No Known Allergies  Social History   Socioeconomic History  . Marital status: Legally Separated    Spouse name: Not on file  . Number of children: 3  . Years of education: Not on file  . Highest education level: Not on file  Occupational History  . Not on file  Tobacco Use  . Smoking status: Never Smoker  . Smokeless tobacco: Never Used  Substance and Sexual Activity  . Alcohol use: Not Currently    Comment: occcasional  . Drug use: Never    Comment: occasional  . Sexual activity: Not on file  Other Topics Concern  . Not on file  Social History Narrative  . Not on file   Social Determinants of Health   Financial Resource Strain:   . Difficulty of  Paying Living Expenses:   Food Insecurity:   . Worried About Programme researcher, broadcasting/film/video in the Last Year:   . Barista in the Last Year:   Transportation Needs:   . Freight forwarder (Medical):   Marland Kitchen Lack of Transportation (Non-Medical):   Physical Activity:   . Days of Exercise per Week:   . Minutes of Exercise per Session:   Stress:   . Feeling of Stress :   Social Connections:   . Frequency of Communication with Friends and Family:   . Frequency of Social Gatherings with Friends and Family:   . Attends Religious Services:   . Active Member of Clubs or Organizations:   . Attends Banker Meetings:   Marland Kitchen Marital Status:   Intimate Partner Violence:   . Fear of Current or Ex-Partner:   . Emotionally Abused:   Marland Kitchen Physically Abused:   . Sexually Abused:     Family History  Problem Relation Age of Onset  . Diabetes Maternal Grandmother   . Diabetes Paternal Grandfather   . Hyperthyroidism Mother     Past Surgical History:  Procedure Laterality Date  . CHOLECYSTECTOMY N/A 10/27/2018   Procedure: LAPAROSCOPIC CHOLECYSTECTOMY;  Surgeon: Harriette Bouillon, MD;  Location: [facility] OR;  Service: General;  Laterality: N/A;  .  WISDOM TOOTH EXTRACTION      ROS: Review of Systems Negative except as stated above  PHYSICAL EXAM: BP 110/70   Pulse 76   Temp 98.4 F (36.9 C)   Resp 16   Ht 5' (1.524 m)   Wt 140 lb 12.8 oz (63.9 kg)   LMP 07/21/2019   SpO2 97%   BMI 27.50 kg/m   Physical Exam  General appearance - alert, well appearing, and in no distress Mental status - normal mood, behavior, speech, dress, motor activity, and thought processes Neck - supple, no significant adenopathy Chest - clear to auscultation, no wheezes, rales or rhonchi, symmetric air entry Heart - normal rate, regular rhythm, normal S1, S2, no murmurs, rubs, clicks or gallops Abdomen - soft, nontender, nondistended, no masses or organomegaly Extremities - peripheral pulses normal, no pedal  edema, no clubbing or cyanosis   CMP Latest Ref Rng & Units 10/26/2018 10/16/2008  Glucose 70 - 99 mg/dL 80 99  BUN 6 - 20 mg/dL 11 11  Creatinine 0.44 - 1.00 mg/dL 0.59 0.50  Sodium 135 - 145 mmol/L 141 137  Potassium 3.5 - 5.1 mmol/L 4.8 3.3(L)  Chloride 98 - 111 mmol/L 111 110  CO2 22 - 32 mmol/L 23 21  Calcium 8.9 - 10.3 mg/dL 9.1 8.8  Total Protein 6.5 - 8.1 g/dL 6.3(L) 6.4  Total Bilirubin 0.3 - 1.2 mg/dL 1.0 0.7  Alkaline Phos 38 - 126 U/L 36(L) 40  AST 15 - 41 U/L 30 21  ALT 0 - 44 U/L 18 20   Lipid Panel  No results found for: CHOL, TRIG, HDL, CHOLHDL, VLDL, LDLCALC, LDLDIRECT  CBC    Component Value Date/Time   WBC 5.3 10/26/2018 0916   RBC 3.92 10/26/2018 0916   HGB 12.1 10/26/2018 0916   HCT 37.2 10/26/2018 0916   PLT 312 10/26/2018 0916   MCV 94.9 10/26/2018 0916   MCH 30.9 10/26/2018 0916   MCHC 32.5 10/26/2018 0916   RDW 12.7 10/26/2018 0916   LYMPHSABS 1.8 10/26/2018 0916   MONOABS 0.6 10/26/2018 0916   EOSABS 0.1 10/26/2018 0916   BASOSABS 0.0 10/26/2018 0916   Results for orders placed or performed in visit on 10/07/19  POCT urine pregnancy  Result Value Ref Range   Preg Test, Ur Positive (A) Negative    ASSESSMENT AND PLAN: 1. Positive pregnancy test Patient in need of prenatal care.  She is uninsured.  We will give her information to be seen at the [facility].  She will apply for family-planning Medicaid.  Advise not to drink alcoholic beverages, smoke or use any street drugs while pregnant.  She will continue prenatal vitamin. - Ambulatory referral to Obstetrics / Gynecology   Patient was given the opportunity to ask questions.  Patient verbalized understanding of the plan and was able to repeat key elements of the plan.   Orders Placed This Encounter  Procedures  . Ambulatory referral to Obstetrics / Gynecology  . POCT urine pregnancy     Requested Prescriptions    No prescriptions requested or ordered in this  encounter    No follow-ups on file.  Karle Plumber, MD, FACP

## 2019-10-07 NOTE — Patient Instructions (Signed)

## 2019-11-15 ENCOUNTER — Other Ambulatory Visit: Payer: Self-pay | Admitting: Nurse Practitioner

## 2019-11-15 DIAGNOSIS — Z3A16 16 weeks gestation of pregnancy: Secondary | ICD-10-CM

## 2019-11-15 DIAGNOSIS — Z363 Encounter for antenatal screening for malformations: Secondary | ICD-10-CM

## 2019-11-15 LAB — OB RESULTS CONSOLE RPR: RPR: NONREACTIVE

## 2019-11-15 LAB — OB RESULTS CONSOLE RUBELLA ANTIBODY, IGM: Rubella: IMMUNE

## 2019-11-15 LAB — OB RESULTS CONSOLE ABO/RH: RH Type: POSITIVE

## 2019-11-15 LAB — OB RESULTS CONSOLE HEPATITIS B SURFACE ANTIGEN: Hepatitis B Surface Ag: NEGATIVE

## 2019-11-15 LAB — OB RESULTS CONSOLE ANTIBODY SCREEN: Antibody Screen: NEGATIVE

## 2019-11-15 LAB — OB RESULTS CONSOLE HIV ANTIBODY (ROUTINE TESTING): HIV: NONREACTIVE

## 2019-11-24 ENCOUNTER — Other Ambulatory Visit: Payer: Self-pay

## 2019-12-01 ENCOUNTER — Encounter: Payer: Self-pay | Admitting: *Deleted

## 2019-12-02 ENCOUNTER — Other Ambulatory Visit: Payer: Self-pay

## 2019-12-02 ENCOUNTER — Ambulatory Visit (HOSPITAL_BASED_OUTPATIENT_CLINIC_OR_DEPARTMENT_OTHER): Payer: Medicaid Other | Admitting: Genetic Counselor

## 2019-12-02 ENCOUNTER — Other Ambulatory Visit: Payer: Self-pay | Admitting: *Deleted

## 2019-12-02 ENCOUNTER — Ambulatory Visit: Payer: Medicaid Other | Admitting: *Deleted

## 2019-12-02 ENCOUNTER — Ambulatory Visit: Payer: Self-pay | Admitting: Genetic Counselor

## 2019-12-02 ENCOUNTER — Ambulatory Visit: Payer: Medicaid Other | Attending: Nurse Practitioner

## 2019-12-02 VITALS — BP 117/70 | HR 110

## 2019-12-02 DIAGNOSIS — E669 Obesity, unspecified: Secondary | ICD-10-CM

## 2019-12-02 DIAGNOSIS — Z3A16 16 weeks gestation of pregnancy: Secondary | ICD-10-CM

## 2019-12-02 DIAGNOSIS — O99012 Anemia complicating pregnancy, second trimester: Secondary | ICD-10-CM

## 2019-12-02 DIAGNOSIS — Z363 Encounter for antenatal screening for malformations: Secondary | ICD-10-CM | POA: Diagnosis not present

## 2019-12-02 DIAGNOSIS — O09529 Supervision of elderly multigravida, unspecified trimester: Secondary | ICD-10-CM | POA: Insufficient documentation

## 2019-12-02 DIAGNOSIS — Z3A20 20 weeks gestation of pregnancy: Secondary | ICD-10-CM

## 2019-12-02 DIAGNOSIS — Z3A19 19 weeks gestation of pregnancy: Secondary | ICD-10-CM

## 2019-12-02 DIAGNOSIS — Z315 Encounter for genetic counseling: Secondary | ICD-10-CM

## 2019-12-02 DIAGNOSIS — O09522 Supervision of elderly multigravida, second trimester: Secondary | ICD-10-CM

## 2019-12-02 DIAGNOSIS — Z362 Encounter for other antenatal screening follow-up: Secondary | ICD-10-CM

## 2019-12-02 NOTE — Progress Notes (Signed)
12/02/2019  [name] 07-14-1975 MRN: [id number] DOV: 12/02/2019  [name] presented to the [facility] for a genetics consultation regarding advanced maternal age. [name] was accompanied to her appointment by her partner, [name].   Indication for genetic counseling - Advanced maternal age  Prenatal history  [name] is a [id number], 44 y.o. female. Her current pregnancy has completed [redacted]w[redacted]d (Estimated Date of Delivery: 04/27/20). [name] has a 2 year old daughter from a prior relationship and an 35 and 66 year old daughter from another relationship. She also has had two elective terminations in the past. The current pregnancy is the first for this couple.  [name] denied exposure to environmental toxins or chemical agents. She denied the use of alcohol, tobacco or street drugs. She reported taking prenatal vitamins and iron. She denied significant viral illnesses, fevers, and bleeding during the course of her pregnancy. She reported a history of anemia. Her medical and surgical histories were otherwise noncontributory.  Family History  A three generation pedigree was drafted and reviewed. The family history is remarkable for the following:  - [name] partner, [name], has a daughter whose son has autism and speech delays. [name] also has a maternal aunt whose son has autism. We reviewed that autism can be isolated, multifactorial, or part of a genetic syndrome; however, underlying genetic conditions account for less than 10% of autism diagnoses. Recurrence risk for first-degree relatives of individuals with idiopathic autism is estimated to be between 2-8%. Given that these individuals are distant relatives to the couple's fetus, the risk of recurrence is likely not greatly elevated above the general population risk of ~1 in 54, or 1.5%. However, autism can be associated with advanced paternal age. See Discussion section for more details.  The  remaining family histories were reviewed and found to be noncontributory for birth defects, intellectual disability, recurrent pregnancy loss, and known genetic conditions. [name] had limited information about his paternal family history; thus, risk assessment was limited.  The patient's ethnicity is [place] and Caucasian The father of the pregnancy's ethnicity is [place]. Ashkenazi Jewish ancestry and consanguinity were denied. Pedigree will be scanned under Media.  Discussion  Advanced maternal age:  [name] was referred to genetic counseling for advanced maternal age, as she will be 44 years old at the time of delivery. We discussed that as a woman ages, the risk for certain chromosomal conditions, such as trisomy 92 (Down syndrome), trisomy 25, and trisomy 18 increases. These conditions often are not inherited, but instead occur due to an error in chromosomal division during the formation of sperm and egg cells in a process called nondisjunction. At her age and during the second trimester, [name] has approximately a 1 in 47 (3.6%) chance of having a child with a chromosomal abnormality. Her age-related risk to have a child with Down syndrome specifically is 1 in 47 (2.1%) in the second trimester. We briefly reviewed features associated with Down syndrome, trisomy 52, and trisomy 58.   Aneuploidy screening results:  [name] previously had quad screening performed which was negative. However, her dating was changed based on today's ultrasound, so the risks identified on quad screening must be recalculated. We reviewed that based on her previous dating, the risk for her pregnancy to be affected by Down syndrome decreased from her age-related risk to 1 in 2756, and the risk for trisomy 18 decreased from her age-related risk to 1 in 14  based on the results of this screen. Additionally, quad screen results were negative for open neural tube defects (ONTD). While this screen significantly  reduces the likelihood of the pregnancy being affected by trisomy 21, trisomy 54, or ONTDs, it cannot be considered diagnostic.   Advanced paternal age:  Given that [name] partner will be 42 years old at the time of delivery, we also reviewed considerations associated with advanced paternal age. Advanced paternal age (APA) is defined as greater than 78 years old at time of conception. The chance of de novo (new) single gene mutations in an individual increases with paternal age.   In addition to single gene disorders, APA is associated with an increased chance for obstetrical complications, such as hypertension, placental abruption, and preterm delivery. Some studies suggest that APA may also be associated with an increased risk for congenital anomalies, such as congenital heart defects. Schizophrenia, bipolar disorder, autism, and some forms of childhood cancer may also be associated with APA, though these risks are less well-understood [name] et al., 2008).  We discussed an available screening option for single gene conditions associated with APA called Vistara. Vistara utilizes noninvasive prenatal screening (NIPS) to screen a pregnancy for 25 autosomal or X-linked dominant single gene disorders. These include conditions associated with APA, such as Noonan syndrome, skeletal dysplasias, and craniosynostoses. Vistara is NOT diagnostic, and a positive result requires confirmation by CVS or amniocentesis. A negative test result does not rule out all single gene disorders. Additionally, Vistara cannot screen for autism or other conditions associated with APA without a known genetic cause. The couple was potentially interested in pursuing Vistara.   Ultrasound:   A complete ultrasound was performed today prior to our visit. The ultrasound report will be sent under separate cover. There were no visualized fetal anomalies or markers suggestive of aneuploidy. [name] was counseled that ~50% of fetuses  with Down syndrome and 90-95% of fetuses with trisomy 83, trisomy 35, or triploidy demonstrate a sign of the respective conditions on anatomy ultrasound. We discussed that fetuses with some of the single gene disorders associated with APA may also demonstrate signs of the condition on ultrasound. However, ultrasound cannot identify all fetuses with these conditions. Ms. Tuma understands this limitation.  Diagnostic testing:  Ms. Izard was also counseled regarding diagnostic testing via amniocentesis. We discussed the technical aspects of the procedure and quoted up to a 1 in 500 (0.2%) risk for spontaneous pregnancy loss or other adverse pregnancy outcomes as a result of amniocentesis. Cultured cells from an amniocentesis sample allow for the visualization of a fetal karyotype, which can detect >99% of chromosomal aberrations. Chromosomal microarray can also be performed to identify smaller deletions or duplications of fetal chromosomal material. After careful consideration, Ms. Salemi declined amniocentesis at this time. She understands that amniocentesis is available at any point after 16 weeks of pregnancy and that she may opt to undergo the procedure at a later date should she change her mind.  Plan:  Ms. Budnick and her partner were potentially interested in pursuing Vistara single gene NIPS. However, they requested more time to make this decision. Ms. Ricklefs will return for an ultrasound on 12/30/19. She will make her decision regarding whether or not to undergo Vistara by that time. She and her partner can have their blood drawn for testing on that day if desired. I provided the couple with a pamphlet on Vistara for their review.  I counseled Ms. Markham regarding the above risks and available options. Second  year [employer] genetic counseling student Norlene Duel participated in portions of today's session under my supervision. The approximate face-to-face time with the genetic counselor was 40 minutes.  In  summary:  Discussed advanced maternal age   2-4% chance for chromosomal aneuploidy based on age at delivery  Reviewed negative quad screening results  Reduction in risk for Down syndrome, trisomy 73, & open neural tube defects  Needs to be recalculated based on updated EDD  Discussed advanced paternal age  Increased chance for de novo single gene conditions, autism, and other conditions  Reviewed results of ultrasound  No fetal anomalies or markers seen  Reduction in risk for fetal aneuploidy  Offered additional testing and screening  Declined amniocentesis  Potentially interested in Vistara single gene NIPS  Reviewed family history concerns   Gershon Crane, MS, Aeronautical engineer

## 2019-12-30 ENCOUNTER — Ambulatory Visit: Payer: Medicaid Other | Admitting: *Deleted

## 2019-12-30 ENCOUNTER — Other Ambulatory Visit: Payer: Self-pay | Admitting: *Deleted

## 2019-12-30 ENCOUNTER — Other Ambulatory Visit: Payer: Self-pay

## 2019-12-30 ENCOUNTER — Ambulatory Visit: Payer: Medicaid Other | Attending: Obstetrics and Gynecology

## 2019-12-30 VITALS — BP 101/62 | HR 117 | Ht 59.0 in

## 2019-12-30 DIAGNOSIS — O09522 Supervision of elderly multigravida, second trimester: Secondary | ICD-10-CM | POA: Diagnosis not present

## 2019-12-30 DIAGNOSIS — D649 Anemia, unspecified: Secondary | ICD-10-CM

## 2019-12-30 DIAGNOSIS — O09529 Supervision of elderly multigravida, unspecified trimester: Secondary | ICD-10-CM

## 2019-12-30 DIAGNOSIS — O99012 Anemia complicating pregnancy, second trimester: Secondary | ICD-10-CM

## 2019-12-30 DIAGNOSIS — Z362 Encounter for other antenatal screening follow-up: Secondary | ICD-10-CM

## 2019-12-30 DIAGNOSIS — Z3A24 24 weeks gestation of pregnancy: Secondary | ICD-10-CM

## 2020-01-27 ENCOUNTER — Other Ambulatory Visit: Payer: Self-pay

## 2020-01-27 ENCOUNTER — Ambulatory Visit: Payer: Medicaid Other | Attending: Obstetrics and Gynecology

## 2020-01-27 ENCOUNTER — Ambulatory Visit: Payer: Medicaid Other | Admitting: *Deleted

## 2020-01-27 ENCOUNTER — Other Ambulatory Visit: Payer: Self-pay | Admitting: *Deleted

## 2020-01-27 VITALS — BP 105/63 | HR 96

## 2020-01-27 DIAGNOSIS — O09523 Supervision of elderly multigravida, third trimester: Secondary | ICD-10-CM

## 2020-01-27 DIAGNOSIS — O09529 Supervision of elderly multigravida, unspecified trimester: Secondary | ICD-10-CM

## 2020-01-27 DIAGNOSIS — O322XX0 Maternal care for transverse and oblique lie, not applicable or unspecified: Secondary | ICD-10-CM | POA: Diagnosis not present

## 2020-01-27 DIAGNOSIS — Z361 Encounter for antenatal screening for raised alphafetoprotein level: Secondary | ICD-10-CM

## 2020-01-27 DIAGNOSIS — D649 Anemia, unspecified: Secondary | ICD-10-CM

## 2020-01-27 DIAGNOSIS — Z3A28 28 weeks gestation of pregnancy: Secondary | ICD-10-CM

## 2020-01-27 DIAGNOSIS — O99013 Anemia complicating pregnancy, third trimester: Secondary | ICD-10-CM | POA: Diagnosis not present

## 2020-02-24 ENCOUNTER — Ambulatory Visit: Payer: Medicaid Other | Attending: Obstetrics and Gynecology

## 2020-02-24 ENCOUNTER — Ambulatory Visit: Payer: Medicaid Other | Admitting: *Deleted

## 2020-02-24 ENCOUNTER — Other Ambulatory Visit: Payer: Self-pay

## 2020-02-24 VITALS — BP 100/58 | HR 100

## 2020-02-24 DIAGNOSIS — O321XX0 Maternal care for breech presentation, not applicable or unspecified: Secondary | ICD-10-CM

## 2020-02-24 DIAGNOSIS — Z362 Encounter for other antenatal screening follow-up: Secondary | ICD-10-CM

## 2020-02-24 DIAGNOSIS — O99013 Anemia complicating pregnancy, third trimester: Secondary | ICD-10-CM | POA: Diagnosis not present

## 2020-02-24 DIAGNOSIS — Z3A32 32 weeks gestation of pregnancy: Secondary | ICD-10-CM

## 2020-02-24 DIAGNOSIS — O09529 Supervision of elderly multigravida, unspecified trimester: Secondary | ICD-10-CM | POA: Insufficient documentation

## 2020-02-24 DIAGNOSIS — O09523 Supervision of elderly multigravida, third trimester: Secondary | ICD-10-CM

## 2020-03-17 ENCOUNTER — Other Ambulatory Visit (HOSPITAL_COMMUNITY): Payer: Self-pay | Admitting: *Deleted

## 2020-03-17 NOTE — Discharge Instructions (Signed)

## 2020-03-20 ENCOUNTER — Other Ambulatory Visit: Payer: Self-pay

## 2020-03-20 ENCOUNTER — Encounter (HOSPITAL_COMMUNITY)
Admission: RE | Admit: 2020-03-20 | Discharge: 2020-03-20 | Disposition: A | Discharge status: Home / Self Care | Payer: Medicaid Other | Source: Ambulatory Visit | Attending: Obstetrics and Gynecology | Admitting: Obstetrics and Gynecology

## 2020-03-20 MED ORDER — SODIUM CHLORIDE 0.9 % IV SOLN
510.0000 mg | INTRAVENOUS | Status: DC
  Filled 2020-03-20: qty 17

## 2020-03-20 NOTE — Progress Notes (Signed)
Pt here today for first dose of IV feraheme.  Her HR was sustained 130's-150's and she says "sometimes this happens to her"  I called and spoke with Dr Jolayne Panther regarding her elevated HR and my concern about giving her IV feraheme with this.  She stated at her last visit her HR was elevated into the 100's but To tell her to continue to take her iron supplements at home, keep all her upcoming appointments, and okay to cancel her feraheme infusions.  Pt verbalized understanding and  I asked her if she was feeling bad and felt as if she needed to go to the ER?  Patient stated no, she was feeling better.  I advised her if she began to feel bad and felt as if her heart rate was getting worse to go to the ER and or call her MD and she verbalized understanding.

## 2020-03-22 LAB — OB RESULTS CONSOLE GC/CHLAMYDIA
Chlamydia: NEGATIVE
Gonorrhea: NEGATIVE

## 2020-03-22 LAB — OB RESULTS CONSOLE GBS: GBS: NEGATIVE

## 2020-03-23 ENCOUNTER — Ambulatory Visit: Payer: Medicaid Other | Attending: Obstetrics and Gynecology

## 2020-03-23 ENCOUNTER — Other Ambulatory Visit: Payer: Self-pay

## 2020-03-23 ENCOUNTER — Ambulatory Visit: Payer: Medicaid Other | Admitting: *Deleted

## 2020-03-23 VITALS — BP 120/68 | HR 95

## 2020-03-23 DIAGNOSIS — O321XX0 Maternal care for breech presentation, not applicable or unspecified: Secondary | ICD-10-CM

## 2020-03-23 DIAGNOSIS — O09523 Supervision of elderly multigravida, third trimester: Secondary | ICD-10-CM

## 2020-03-23 DIAGNOSIS — O09529 Supervision of elderly multigravida, unspecified trimester: Secondary | ICD-10-CM

## 2020-03-23 DIAGNOSIS — Z3A36 36 weeks gestation of pregnancy: Secondary | ICD-10-CM | POA: Diagnosis not present

## 2020-03-23 DIAGNOSIS — O99013 Anemia complicating pregnancy, third trimester: Secondary | ICD-10-CM | POA: Diagnosis not present

## 2020-03-23 DIAGNOSIS — Z362 Encounter for other antenatal screening follow-up: Secondary | ICD-10-CM

## 2020-03-24 ENCOUNTER — Other Ambulatory Visit: Payer: Self-pay | Admitting: *Deleted

## 2020-03-24 DIAGNOSIS — O09523 Supervision of elderly multigravida, third trimester: Secondary | ICD-10-CM

## 2020-03-27 ENCOUNTER — Telehealth: Payer: Self-pay

## 2020-03-27 ENCOUNTER — Encounter (HOSPITAL_COMMUNITY): Payer: Medicaid Other

## 2020-03-27 NOTE — Telephone Encounter (Signed)
Pt called stating that she has some questions.  Called pt and unable to leave message due to voicemail box not set up yet.    Addison Naegeli, RN  03/27/20

## 2020-03-30 ENCOUNTER — Other Ambulatory Visit: Payer: Self-pay

## 2020-03-30 ENCOUNTER — Ambulatory Visit: Payer: Medicaid Other | Attending: Obstetrics and Gynecology | Admitting: *Deleted

## 2020-03-30 ENCOUNTER — Ambulatory Visit: Payer: Medicaid Other | Admitting: *Deleted

## 2020-03-30 ENCOUNTER — Encounter: Payer: Self-pay | Admitting: *Deleted

## 2020-03-30 VITALS — BP 110/60 | HR 107

## 2020-03-30 DIAGNOSIS — O09523 Supervision of elderly multigravida, third trimester: Secondary | ICD-10-CM

## 2020-03-30 DIAGNOSIS — O09513 Supervision of elderly primigravida, third trimester: Secondary | ICD-10-CM | POA: Diagnosis not present

## 2020-03-30 DIAGNOSIS — O09529 Supervision of elderly multigravida, unspecified trimester: Secondary | ICD-10-CM | POA: Diagnosis not present

## 2020-03-30 DIAGNOSIS — Z3A37 37 weeks gestation of pregnancy: Secondary | ICD-10-CM | POA: Diagnosis not present

## 2020-03-30 NOTE — Procedures (Signed)
[name] 12/26/1975 [redacted]w[redacted]d  Fetus A Non-Stress Test Interpretation for 03/30/20  Indication: Advanced Maternal Age >40 years  Fetal Heart Rate A Mode: External Baseline Rate (A): 135 bpm Variability: Moderate Accelerations: 15 x 15 Decelerations: None Multiple birth?: No  Uterine Activity Mode: Palpation, Toco Contraction Frequency (min): none noted Resting Tone Palpated: Relaxed Resting Time: Adequate  Interpretation (Fetal Testing) Nonstress Test Interpretation: Reactive Comments: Reviewed tracing with Dr. Parke Poisson

## 2020-03-30 NOTE — Progress Notes (Signed)
Presents for NST.

## 2020-03-31 ENCOUNTER — Inpatient Hospital Stay (HOSPITAL_COMMUNITY)
Admission: AD | Admit: 2020-03-31 | Discharge: 2020-03-31 | Disposition: A | Discharge status: Home / Self Care | Payer: Medicaid Other | Attending: Obstetrics and Gynecology | Admitting: Obstetrics and Gynecology

## 2020-03-31 ENCOUNTER — Other Ambulatory Visit: Payer: Self-pay

## 2020-03-31 ENCOUNTER — Encounter (HOSPITAL_COMMUNITY): Payer: Self-pay | Admitting: Obstetrics and Gynecology

## 2020-03-31 DIAGNOSIS — Z3689 Encounter for other specified antenatal screening: Secondary | ICD-10-CM

## 2020-03-31 DIAGNOSIS — O479 False labor, unspecified: Secondary | ICD-10-CM | POA: Insufficient documentation

## 2020-03-31 DIAGNOSIS — Z3A37 37 weeks gestation of pregnancy: Secondary | ICD-10-CM | POA: Diagnosis not present

## 2020-03-31 HISTORY — DX: Unspecified asthma, uncomplicated: J45.909

## 2020-03-31 LAB — URINALYSIS, ROUTINE W REFLEX MICROSCOPIC
Bilirubin Urine: NEGATIVE
Glucose, UA: NEGATIVE mg/dL
Hgb urine dipstick: NEGATIVE
Ketones, ur: NEGATIVE mg/dL
Leukocytes,Ua: NEGATIVE
Nitrite: NEGATIVE
Protein, ur: NEGATIVE mg/dL
Specific Gravity, Urine: 1.016 (ref 1.005–1.030)
pH: 6 (ref 5.0–8.0)

## 2020-03-31 NOTE — MAU Provider Note (Addendum)
S: [name] is a 44 y.o. [id number] at [redacted]w[redacted]d  who presents to MAU today for labor evaluation and concern for bleeding. Owens reports patient complaining of pelvic pressure and noticed red blood on toilet paper after a bowel movement. Does not feel constipated, but was straining during the BM. Denies passage of clots, abdominal pain, contractions, continuing vaginal bleeding or mucousy discharge.  Cervical exam by Owens (twice, separated by over an hour) Dilation: 1.5 Effacement (%): 40 Cervical Position: Posterior Station: Ballotable Presentation: Undeterminable Exam by:: [name]  Fetal Monitoring: reactive Baseline: 130 Variability: mod Accelerations: yes Decelerations: no Contractions: none  MDM Discussed patient with Owens: no blood noted on either cervical exam, pt not laboring. NST reviewed and reactive.   A: [facility]UP at [redacted]w[redacted]d  False labor   P: Discharge home with bleeding & labor precautions Labor precautions and kick counts included in AVS Scheduled for C-section on 11/23 due to breech presentation Patient to follow-up with prenatal care provider as scheduled  Patient may return to MAU as needed or when in labor   Mallory Oka, MD 03/31/2020 6:09 PM   Attestation of Supervision of Student:  I confirm that I have verified the information documented in the medical student's note and that I have also personally performed the history, physical exam and all medical decision making activities.  I have verified that all services and findings are accurately documented in this student's note; and I agree with management and plan as outlined in the documentation. I have also made any necessary editorial changes.  Consulted with Dr. Earlene Plater prior to pt discharge, she agreed pt could go home based on absence of labor signs and likelihood of blood being due to bowel movement.   Bernerd Limbo, CNM [facility] [employer], [facility] 04/01/2020 10:14 PM

## 2020-03-31 NOTE — Discharge Instructions (Signed)
Braxton Hicks Contractions Contractions of the uterus can occur throughout pregnancy, but they are not always a sign that you are in labor. You may have practice contractions called Braxton Hicks contractions. These false labor contractions are sometimes confused with true labor. What are Braxton Hicks contractions? Braxton Hicks contractions are tightening movements that occur in the muscles of the uterus before labor. Unlike true labor contractions, these contractions do not result in opening (dilation) and thinning of the cervix. Toward the end of pregnancy (32-34 weeks), Braxton Hicks contractions can happen more often and may become stronger. These contractions are sometimes difficult to tell apart from true labor because they can be very uncomfortable. You should not feel embarrassed if you go to the hospital with false labor. Sometimes, the only way to tell if you are in true labor is for your health care provider to look for changes in the cervix. The health care provider will do a physical exam and may monitor your contractions. If you are not in true labor, the exam should show that your cervix is not dilating and your water has not broken. If there are no other health problems associated with your pregnancy, it is completely safe for you to be sent home with false labor. You may continue to have Braxton Hicks contractions until you go into true labor. How to tell the difference between true labor and false labor True labor  Contractions last 30-70 seconds.  Contractions become very regular.  Discomfort is usually felt in the top of the uterus, and it spreads to the lower abdomen and low back.  Contractions do not go away with walking.  Contractions usually become more intense and increase in frequency.  The cervix dilates and gets thinner. False labor  Contractions are usually shorter and not as strong as true labor contractions.  Contractions are usually irregular.  Contractions  are often felt in the front of the lower abdomen and in the groin.  Contractions may go away when you walk around or change positions while lying down.  Contractions get weaker and are shorter-lasting as time goes on.  The cervix usually does not dilate or become thin. Follow these instructions at home:   Take over-the-counter and prescription medicines only as told by your health care provider.  Keep up with your usual exercises and follow other instructions from your health care provider.  Eat and drink lightly if you think you are going into labor.  If Braxton Hicks contractions are making you uncomfortable: ? Change your position from lying down or resting to walking, or change from walking to resting. ? Sit and rest in a tub of warm water. ? Drink enough fluid to keep your urine pale yellow. Dehydration may cause these contractions. ? Do slow and deep breathing several times an hour.  Keep all follow-up prenatal visits as told by your health care provider. This is important. Contact a health care provider if:  You have a fever.  You have continuous pain in your abdomen. Get help right away if:  Your contractions become stronger, more regular, and closer together.  You have fluid leaking or gushing from your vagina.  You pass blood-tinged mucus (bloody show).  You have bleeding from your vagina.  You have low back pain that you never had before.  You feel your baby's head pushing down and causing pelvic pressure.  Your baby is not moving inside you as much as it used to. Summary  Contractions that occur before labor are   called Braxton Hicks contractions, false labor, or practice contractions.  Braxton Hicks contractions are usually shorter, weaker, farther apart, and less regular than true labor contractions. True labor contractions usually become progressively stronger and regular, and they become more frequent.  Manage discomfort from Braxton Hicks contractions  by changing position, resting in a warm bath, drinking plenty of water, or practicing deep breathing. This information is not intended to replace advice given to you by your health care provider. Make sure you discuss any questions you have with your health care provider. Document Revised: 04/18/2017 Document Reviewed: 09/19/2016 Elsevier Patient Education  2020 Elsevier Inc. Fetal Movement Counts Patient Name: ________________________________________________ Patient Due Date: ____________________ What is a fetal movement count?  A fetal movement count is the number of times that you feel your baby move during a certain amount of time. This may also be called a fetal kick count. A fetal movement count is recommended for every pregnant woman. You may be asked to start counting fetal movements as early as week 28 of your pregnancy. Pay attention to when your baby is most active. You may notice your baby's sleep and wake cycles. You may also notice things that make your baby move more. You should do a fetal movement count:  When your baby is normally most active.  At the same time each day. A good time to count movements is while you are resting, after having something to eat and drink. How do I count fetal movements? 1. Find a quiet, comfortable area. Sit, or lie down on your side. 2. Write down the date, the start time and stop time, and the number of movements that you felt between those two times. Take this information with you to your health care visits. 3. Write down your start time when you feel the first movement. 4. Count kicks, flutters, swishes, rolls, and jabs. You should feel at least 10 movements. 5. You may stop counting after you have felt 10 movements, or if you have been counting for 2 hours. Write down the stop time. 6. If you do not feel 10 movements in 2 hours, contact your health care provider for further instructions. Your health care provider may want to do additional tests  to assess your baby's well-being. Contact a health care provider if:  You feel fewer than 10 movements in 2 hours.  Your baby is not moving like he or she usually does. Date: ____________ Start time: ____________ Stop time: ____________ Movements: ____________ Date: ____________ Start time: ____________ Stop time: ____________ Movements: ____________ Date: ____________ Start time: ____________ Stop time: ____________ Movements: ____________ Date: ____________ Start time: ____________ Stop time: ____________ Movements: ____________ Date: ____________ Start time: ____________ Stop time: ____________ Movements: ____________ Date: ____________ Start time: ____________ Stop time: ____________ Movements: ____________ Date: ____________ Start time: ____________ Stop time: ____________ Movements: ____________ Date: ____________ Start time: ____________ Stop time: ____________ Movements: ____________ Date: ____________ Start time: ____________ Stop time: ____________ Movements: ____________ This information is not intended to replace advice given to you by your health care provider. Make sure you discuss any questions you have with your health care provider. Document Revised: 12/24/2018 Document Reviewed: 12/24/2018 Elsevier Patient Education  2020 Elsevier Inc.  

## 2020-03-31 NOTE — MAU Note (Signed)
Bleeding a little bit, having a lot of pressure.  Last night had a lot of discharge.  Having a lot of pelvic pain/pressure- makes it hard to walk. Unsure if contracting. Last check was 2 wks ago.  Baby is breech.- planning c/s, scheduled 11/23.

## 2020-03-31 NOTE — MAU Note (Signed)
In for reevaluation of pt-no additional vaginal bleeding or discharge. Pt reported bleeding was small amount of red bleeding following BM. Pt states it was vaginal and not rectal. SVE recheck unchanged with presenting part out of the pelvis. No contractions or cramping per monitor, pt report or palpation.

## 2020-04-03 ENCOUNTER — Telehealth (HOSPITAL_COMMUNITY): Payer: Self-pay | Admitting: *Deleted

## 2020-04-03 NOTE — Telephone Encounter (Signed)
Preadmission screen  

## 2020-04-03 NOTE — Patient Instructions (Signed)
[name]  04/03/2020   Your procedure is scheduled on:  04/11/2020  Arrive at 1245 at Entrance C on [address] at [facility]  and [employer]. You are invited to use the FREE valet parking or use the Visitor's parking deck.  Pick up the phone at the desk and dial [phone].  Call this number if you have problems the morning of surgery: [phone]  Remember:   Do not eat food:(After Midnight) Desps de medianoche.  Do not drink clear liquids: (After Midnight) Desps de medianoche.  Take these medicines the morning of surgery with A SIP OF WATER:  none   Do not wear jewelry, make-up or nail polish.  Do not wear lotions, powders, or perfumes. Do not wear deodorant.  Do not shave 48 hours prior to surgery.  Do not bring valuables to the hospital.  [facility] is not   responsible for any belongings or valuables brought to the hospital.  Contacts, dentures or bridgework may not be worn into surgery.  Leave suitcase in the car. After surgery it may be brought to your room.  For patients admitted to the hospital, checkout time is 11:00 AM the day of              discharge.      Please read over the following fact sheets that you were given:     Preparing for Surgery

## 2020-04-04 ENCOUNTER — Encounter: Payer: Self-pay | Admitting: Family Medicine

## 2020-04-04 ENCOUNTER — Other Ambulatory Visit: Payer: Self-pay | Admitting: Family Medicine

## 2020-04-04 ENCOUNTER — Telehealth (HOSPITAL_COMMUNITY): Payer: Self-pay | Admitting: *Deleted

## 2020-04-04 DIAGNOSIS — Z98891 History of uterine scar from previous surgery: Secondary | ICD-10-CM

## 2020-04-04 NOTE — Telephone Encounter (Signed)
Preadmission screen  

## 2020-04-05 ENCOUNTER — Telehealth (HOSPITAL_COMMUNITY): Payer: Self-pay | Admitting: *Deleted

## 2020-04-05 NOTE — Telephone Encounter (Signed)
Preadmission screen  

## 2020-04-07 ENCOUNTER — Other Ambulatory Visit: Payer: Self-pay | Admitting: Obstetrics

## 2020-04-07 ENCOUNTER — Ambulatory Visit: Payer: Medicaid Other | Attending: Obstetrics and Gynecology

## 2020-04-07 ENCOUNTER — Encounter: Payer: Self-pay | Admitting: *Deleted

## 2020-04-07 ENCOUNTER — Other Ambulatory Visit: Payer: Self-pay

## 2020-04-07 ENCOUNTER — Telehealth (HOSPITAL_COMMUNITY): Payer: Self-pay | Admitting: *Deleted

## 2020-04-07 ENCOUNTER — Ambulatory Visit: Payer: Medicaid Other | Admitting: *Deleted

## 2020-04-07 VITALS — BP 114/63 | HR 80

## 2020-04-07 DIAGNOSIS — Z3A28 28 weeks gestation of pregnancy: Secondary | ICD-10-CM | POA: Diagnosis not present

## 2020-04-07 DIAGNOSIS — O09529 Supervision of elderly multigravida, unspecified trimester: Secondary | ICD-10-CM | POA: Diagnosis present

## 2020-04-07 DIAGNOSIS — O09523 Supervision of elderly multigravida, third trimester: Secondary | ICD-10-CM | POA: Insufficient documentation

## 2020-04-07 DIAGNOSIS — Z362 Encounter for other antenatal screening follow-up: Secondary | ICD-10-CM

## 2020-04-07 DIAGNOSIS — O99013 Anemia complicating pregnancy, third trimester: Secondary | ICD-10-CM

## 2020-04-07 DIAGNOSIS — D649 Anemia, unspecified: Secondary | ICD-10-CM

## 2020-04-07 NOTE — Procedures (Signed)
TRANESHA LESSNER 01-11-1976 [redacted]w[redacted]d  Fetus A Non-Stress Test Interpretation for 04/07/20  Indication: Unsatisfactory BPP  Fetal Heart Rate A Mode: External Baseline Rate (A): 140 bpm Variability: Moderate Accelerations: 15 x 15 Decelerations: None Multiple birth?: No  Uterine Activity Mode: Palpation, Toco Contraction Frequency (min): 4-7 Contraction Duration (sec): 50-80 Contraction Quality: Mild Resting Tone Palpated: Relaxed Resting Time: Adequate  Interpretation (Fetal Testing) Nonstress Test Interpretation: Reactive Comments: Reviewed tracing with Dr. Judeth Cornfield

## 2020-04-07 NOTE — Telephone Encounter (Signed)
Preadmission screen  

## 2020-04-10 ENCOUNTER — Encounter (HOSPITAL_COMMUNITY): Payer: Self-pay

## 2020-04-10 ENCOUNTER — Telehealth: Payer: Self-pay | Admitting: Obstetrics and Gynecology

## 2020-04-10 ENCOUNTER — Encounter (HOSPITAL_COMMUNITY)
Admission: RE | Admit: 2020-04-10 | Discharge: 2020-04-10 | Disposition: A | Discharge status: Home / Self Care | Payer: Medicaid Other | Source: Ambulatory Visit | Attending: Family Medicine | Admitting: Family Medicine

## 2020-04-10 ENCOUNTER — Other Ambulatory Visit (HOSPITAL_COMMUNITY)
Admission: RE | Admit: 2020-04-10 | Discharge: 2020-04-10 | Disposition: A | Discharge status: Home / Self Care | Payer: Medicaid Other | Source: Ambulatory Visit | Attending: Family Medicine | Admitting: Family Medicine

## 2020-04-10 DIAGNOSIS — Z01812 Encounter for preprocedural laboratory examination: Secondary | ICD-10-CM | POA: Insufficient documentation

## 2020-04-10 DIAGNOSIS — Z20822 Contact with and (suspected) exposure to covid-19: Secondary | ICD-10-CM | POA: Insufficient documentation

## 2020-04-10 LAB — SARS CORONAVIRUS 2 (TAT 6-24 HRS): SARS Coronavirus 2: NEGATIVE

## 2020-04-10 NOTE — Pre-Procedure Instructions (Signed)
Dr Ashok Pall aware of vertex [place] on 11/19.  Pt to arrive at 1200 on 11/23 NPO after MN.  Will be rescanned.  If still vertex, will induce.  If not vertex, will either do a version or proceed with CS.  Dr Ashok Pall said pt to keep her covid appointment but can cancel preop visit and do labs on admission if breech and doing CS.    Pt aware of plan and instructions.  Verbalized understanding.

## 2020-04-10 NOTE — Telephone Encounter (Signed)
Pt scheduled for c/s 11/23 for breech. Had u/s few days ago showing vtx presentation. [name] called me, I also spoke w/ L & D charge nurse. Plan will be for patient to present tomorrow as scheduled for c/s. If still vertex plan to admit and induce. If not vertex will plan on version vs. scheduled section. Ms. Olegario Shearer to make patient aware.

## 2020-04-11 ENCOUNTER — Encounter (HOSPITAL_COMMUNITY)
Admission: RE | Disposition: A | Discharge status: Home / Self Care | Payer: Self-pay | Source: Home / Self Care | Attending: Obstetrics & Gynecology

## 2020-04-11 ENCOUNTER — Other Ambulatory Visit: Payer: Self-pay

## 2020-04-11 ENCOUNTER — Encounter (HOSPITAL_COMMUNITY): Payer: Self-pay | Admitting: Family Medicine

## 2020-04-11 ENCOUNTER — Inpatient Hospital Stay (HOSPITAL_COMMUNITY)
Admission: RE | Admit: 2020-04-11 | Discharge: 2020-04-14 | DRG: 807 | Disposition: A | Discharge status: Home / Self Care | Payer: Medicaid Other | Attending: Obstetrics & Gynecology | Admitting: Obstetrics & Gynecology

## 2020-04-11 DIAGNOSIS — Z8759 Personal history of other complications of pregnancy, childbirth and the puerperium: Secondary | ICD-10-CM | POA: Diagnosis not present

## 2020-04-11 DIAGNOSIS — O320XX0 Maternal care for unstable lie, not applicable or unspecified: Secondary | ICD-10-CM | POA: Diagnosis present

## 2020-04-11 DIAGNOSIS — Z3A39 39 weeks gestation of pregnancy: Secondary | ICD-10-CM | POA: Diagnosis not present

## 2020-04-11 DIAGNOSIS — Z20822 Contact with and (suspected) exposure to covid-19: Secondary | ICD-10-CM | POA: Diagnosis present

## 2020-04-11 DIAGNOSIS — Z87891 Personal history of nicotine dependence: Secondary | ICD-10-CM | POA: Diagnosis not present

## 2020-04-11 DIAGNOSIS — Z3493 Encounter for supervision of normal pregnancy, unspecified, third trimester: Secondary | ICD-10-CM

## 2020-04-11 LAB — TYPE AND SCREEN
ABO/RH(D): O POS
Antibody Screen: NEGATIVE

## 2020-04-11 LAB — RAPID HIV SCREEN (HIV 1/2 AB+AG)
HIV 1/2 Antibodies: NONREACTIVE
HIV-1 P24 Antigen - HIV24: NONREACTIVE
Interpretation (HIV Ag Ab): NONREACTIVE

## 2020-04-11 LAB — CBC
HCT: 30.8 % — ABNORMAL LOW (ref 36.0–46.0)
Hemoglobin: 10.1 g/dL — ABNORMAL LOW (ref 12.0–15.0)
MCH: 31.6 pg (ref 26.0–34.0)
MCHC: 32.8 g/dL (ref 30.0–36.0)
MCV: 96.3 fL (ref 80.0–100.0)
Platelets: 206 10*3/uL (ref 150–400)
RBC: 3.2 MIL/uL — ABNORMAL LOW (ref 3.87–5.11)
RDW: 13.2 % (ref 11.5–15.5)
WBC: 6.9 10*3/uL (ref 4.0–10.5)
nRBC: 0 % (ref 0.0–0.2)

## 2020-04-11 SURGERY — Surgical Case
Anesthesia: Choice | Laterality: Bilateral

## 2020-04-11 MED ORDER — OXYCODONE-ACETAMINOPHEN 5-325 MG PO TABS: 1.0000 | ORAL_TABLET | ORAL | Status: DC | PRN

## 2020-04-11 MED ORDER — LACTATED RINGERS IV SOLN: 500.0000 mL | INTRAVENOUS | Status: DC | PRN

## 2020-04-11 MED ORDER — LACTATED RINGERS IV SOLN
500.0000 mL | INTRAVENOUS | Status: DC | PRN
  Administered 2020-04-11 – 2020-04-13 (×3): 500 mL via INTRAVENOUS

## 2020-04-11 MED ORDER — OXYTOCIN-SODIUM CHLORIDE 30-0.9 UT/500ML-% IV SOLN: 2.5000 [IU]/h | INTRAVENOUS | Status: DC

## 2020-04-11 MED ORDER — OXYCODONE-ACETAMINOPHEN 5-325 MG PO TABS: 2.0000 | ORAL_TABLET | ORAL | Status: DC | PRN

## 2020-04-11 MED ORDER — LIDOCAINE HCL (PF) 1 % IJ SOLN: 30.0000 mL | INTRAMUSCULAR | Status: DC | PRN

## 2020-04-11 MED ORDER — SOD CITRATE-CITRIC ACID 500-334 MG/5ML PO SOLN: 30.0000 mL | ORAL | Status: DC | PRN

## 2020-04-11 MED ORDER — ACETAMINOPHEN 325 MG PO TABS: 650.0000 mg | ORAL_TABLET | ORAL | Status: DC | PRN

## 2020-04-11 MED ORDER — OXYTOCIN BOLUS FROM INFUSION
333.0000 mL | Freq: Once | INTRAVENOUS | Status: AC
  Administered 2020-04-13: 333 mL via INTRAVENOUS

## 2020-04-11 MED ORDER — MISOPROSTOL 50MCG HALF TABLET
50.0000 ug | ORAL_TABLET | ORAL | Status: DC | PRN
  Administered 2020-04-11 – 2020-04-12 (×4): 50 ug via ORAL
  Filled 2020-04-11 (×5): qty 1

## 2020-04-11 MED ORDER — ONDANSETRON HCL 4 MG/2ML IJ SOLN
4.0000 mg | Freq: Four times a day (QID) | INTRAMUSCULAR | Status: DC | PRN
  Filled 2020-04-11: qty 2

## 2020-04-11 MED ORDER — OXYTOCIN BOLUS FROM INFUSION: 333.0000 mL | Freq: Once | INTRAVENOUS | Status: DC

## 2020-04-11 MED ORDER — LIDOCAINE HCL (PF) 1 % IJ SOLN
30.0000 mL | INTRAMUSCULAR | Status: DC | PRN
  Filled 2020-04-11: qty 30

## 2020-04-11 MED ORDER — FENTANYL CITRATE (PF) 100 MCG/2ML IJ SOLN
50.0000 ug | INTRAMUSCULAR | Status: DC | PRN
  Administered 2020-04-11 – 2020-04-12 (×6): 100 ug via INTRAVENOUS
  Filled 2020-04-11 (×6): qty 2

## 2020-04-11 MED ORDER — ONDANSETRON HCL 4 MG/2ML IJ SOLN: 4.0000 mg | Freq: Four times a day (QID) | INTRAMUSCULAR | Status: DC | PRN

## 2020-04-11 MED ORDER — LACTATED RINGERS IV SOLN: INTRAVENOUS | Status: DC

## 2020-04-11 MED ORDER — TERBUTALINE SULFATE 1 MG/ML IJ SOLN: 0.2500 mg | Freq: Once | INTRAMUSCULAR | Status: DC | PRN

## 2020-04-11 MED ORDER — MISOPROSTOL 200 MCG PO TABS: 50.0000 ug | ORAL_TABLET | Freq: Once | ORAL | Status: DC

## 2020-04-11 NOTE — H&P (Addendum)
[name] is a 44 y.o. female [phone] with IUP at [redacted]w[redacted]d by [place] presenting for IOL due to unstable lie. Scheduled for c/s for malpresentation, now cephalic.  Reports fetal movement. Denies vaginal bleeding.  She received her prenatal care at [facility].  Support person in labor: significant other  Ultrasounds . Anatomy U/S: Normal 12/02/19 . [place] Follow up: 04/07/2020 . EFW: 2919 gm 6 lb 7 oz . Amniotic fluid volume within normal limits  Prenatal History/Complications: . AMA . Malpresentation . Depression . Asthma . Anemia . ASCUS, +HPV . +UDS, marijuana  Past Medical History: Past Medical History:  Diagnosis Date  . Anemia   . Asthma    childhood - no longer issue per pt  . Cholecystitis 2020  . Depression     Past Surgical History: Past Surgical History:  Procedure Laterality Date  . CHOLECYSTECTOMY N/A 10/27/2018   Procedure: LAPAROSCOPIC CHOLECYSTECTOMY;  Surgeon: Harriette Bouillon, MD;  Location: [facility] OR;  Service: General;  Laterality: N/A;  . WISDOM TOOTH EXTRACTION      Obstetrical History: OB History    Gravida  6   Para  3   Term  3   Preterm      AB  2   Living  3     SAB      TAB  2   Ectopic      Multiple      Live Births              Social History: Social History   Socioeconomic History  . Marital status: Legally Separated    Spouse name: Not on file  . Number of children: 3  . Years of education: Not on file  . Highest education level: Not on file  Occupational History  . Not on file  Tobacco Use  . Smoking status: Former Games developer  . Smokeless tobacco: Never Used  . Tobacco comment: marijuana last week  Vaping Use  . Vaping Use: Never used  Substance and Sexual Activity  . Alcohol use: Not Currently    Comment: occcasional  . Drug use: Yes    Types: Marijuana    Comment: discontinued 1 week ago  . Sexual activity: Not on file  Other Topics Concern  . Not on file  Social History Narrative  . Not on file   Social  Determinants of Health   Financial Resource Strain:   . Difficulty of Paying Living Expenses: Not on file  Food Insecurity:   . Worried About Programme researcher, broadcasting/film/video in the Last Year: Not on file  . Ran Out of Food in the Last Year: Not on file  Transportation Needs:   . Lack of Transportation (Medical): Not on file  . Lack of Transportation (Non-Medical): Not on file  Physical Activity:   . Days of Exercise per Week: Not on file  . Minutes of Exercise per Session: Not on file  Stress:   . Feeling of Stress : Not on file  Social Connections:   . Frequency of Communication with Friends and Family: Not on file  . Frequency of Social Gatherings with Friends and Family: Not on file  . Attends Religious Services: Not on file  . Active Member of Clubs or Organizations: Not on file  . Attends Banker Meetings: Not on file  . Marital Status: Not on file    Family History: Family History  Problem Relation Age of Onset  . Diabetes Maternal Grandmother   . Diabetes  Paternal Grandfather   . Hyperthyroidism Mother     Allergies: No Known Allergies  Medications Prior to Admission  Medication Sig Dispense Refill Last Dose  . acetaminophen (TYLENOL) 500 MG tablet Take 1,000 mg by mouth every 6 (six) hours as needed for moderate pain or headache.     . doxylamine, Sleep, (UNISOM) 25 MG tablet Take 25 mg by mouth at bedtime as needed for sleep.     . famotidine (ZANTAC 360) 10 MG tablet Take 10 mg by mouth daily as needed for heartburn or indigestion.     . FEROSUL 325 (65 Fe) MG tablet Take 325 mg by mouth 2 (two) times daily.     . Homeopathic Products (ZICAM ALLERGY RELIEF NA) Place 1 spray into the nose daily as needed (allergies).     . Prenatal Vit-Fe Fumarate-FA (WESTAB PLUS) 27-1 MG TABS Take 1 tablet by mouth daily.        Review of Systems  All systems reviewed and negative except as stated in HPI   Resp. rate 18, height 4\' 11"  (1.499 m), weight 81.7 kg, last  menstrual period 07/22/2019. General appearance: alert, cooperative, appears stated age and no distress Lungs: no respiratory distress Heart: regular rate  Abdomen: soft, non-tender; gravid b Pelvic: adequate for delivery Extremities: Homans sign is negative, no sign of DVT Presentation: cephalic Fetal monitoring: 140 bpm, moderate, accels present, decels absent Uterine activity: regular; every 4-7 minutes Dilation: 1.5 Effacement (%): Thick Station: Ballotable Exam by:: Dr. [email or web address]  Prenatal labs: ABO, Rh: --/--/O POS (11/23 1223) Antibody: PENDING (11/23 1223) Rubella: Immune (06/28 0000) RPR: Nonreactive (06/28 0000)  HBsAg: Negative (06/28 0000)  HIV: Non-reactive (06/28 0000)  GBS: Negative/-- (11/03 0000)  Glucola: 1 hr: 124 (on 11/15/2019) 1 hr: 103 (on 01/25/2020)  Genetic screening: Neg Quad  Prenatal Transfer Tool  Maternal Diabetes: No Genetic Screening: Normal Maternal Ultrasounds/Referrals: Normal Fetal Ultrasounds or other Referrals:  None Maternal Substance Abuse:  No Significant Maternal Medications:  None Significant Maternal Lab Results: Group B Strep negative  Results for orders placed or performed during the hospital encounter of 04/11/20 (from the past 24 hour(s))  Type and screen   Collection Time: 04/11/20 12:23 PM  Result Value Ref Range   ABO/RH(D) O POS    Antibody Screen PENDING    Sample Expiration      04/14/2020,2359 Performed at [facility], [address], [place], [place]   CBC   Collection Time: 04/11/20 12:30 PM  Result Value Ref Range   WBC 6.9 4.0 - 10.5 K/uL   RBC 3.20 (L) 3.87 - 5.11 MIL/uL   Hemoglobin 10.1 (L) 12.0 - 15.0 g/dL   HCT 04/13/20 (L) 36 - 46 %   MCV 96.3 80.0 - 100.0 fL   MCH 31.6 26.0 - 34.0 pg   MCHC 32.8 30.0 - 36.0 g/dL   RDW 86.5 78.4 - 69.6 %   Platelets 206 150 - 400 K/uL   nRBC 0.0 0.0 - 0.2 %    Patient Active Problem List   Diagnosis Date Noted  . Supervision of low-risk pregnancy,  third trimester 04/11/2020  . Indication for care in labor and delivery, antepartum 04/11/2020    Assessment/Plan:  [name] is a 44 y.o. 47 at [redacted]w[redacted]d here for IOL r/t unstable lie.   Labor: IOL using Cytotec PO -- pain control: desires epidural  Fetal Wellbeing: EFW 2919 gm by [redacted]w[redacted]d. Cephalic by [place].  -- GBS (negative) -- continuous  fetal monitoring   Postpartum Planning -- boy/breast & bottle/circumcision desired --Contraception: Interval BTL   Sande Rives, Student-MidWife  04/11/2020, 1:30 PM  Midwife attestation: I have seen and examined this patient; I agree with above documentation in the student's note.   PE: Gen: calm comfortable, NAD Resp: normal effort and rate Abd: gravid  ROS, labs, PMH reviewed  Assessment/Plan: [redacted] weeks gestation Unstable lie Labor: latent FWB: Cat I GBS: neg Admit to LD IOL, cervical ripening Anticipate SVD  Donette Larry, CNM  04/11/2020, 2:19 PM

## 2020-04-11 NOTE — Progress Notes (Addendum)
[name] is a 44 y.o. [phone] at [redacted]w[redacted]d by ultrasound admitted for induction of labor due to induction of labor due to unstable lie.  Subjective: Patient is comfortable, no complaints. Denies cramping, contractions, vaginal bleeding, leaking of fluid.  Objective: BP (!) 90/45 Comment: pt on side; asymptomatic  Pulse 89   Temp 98.9 F (37.2 C) (Oral)   Resp 18   Ht 4\' 11"  (1.499 m)   Wt 81.7 kg   LMP 07/22/2019 (Approximate)   BMI 36.36 kg/m  No intake/output data recorded. No intake/output data recorded.  FHT:  FHR: 135 bpm, variability: moderate,  accelerations:  Present,  decelerations:  Absent UC:   None; irritability noted  SVE:   Dilation: 1.5 Effacement (%): 60 Station: -3 Exam by:: [email or web address], SCNM   BSUS performed to confirm vertex position.   Labs: Lab Results  Component Value Date   WBC 6.9 04/11/2020   HGB 10.1 (L) 04/11/2020   HCT 30.8 (L) 04/11/2020   MCV 96.3 04/11/2020   PLT 206 04/11/2020    Assessment / Plan: Induction of labor, progressing well  Labor:  IOL with cytotec, FB attempt at this time without success. Will continue with second dose of cytotec and attempt FB again at next SVE Preeclampsia:   n/a Fetal Wellbeing:  Category I Pain Control:  Labor support without medications I/D:  n/a Anticipated MOD:  NSVD  04/13/2020 04/11/2020, 5:15 PM  Midwife attestation I agree with the documentation in the student's note.   04/13/2020, CNM 5:23 PM

## 2020-04-11 NOTE — Progress Notes (Addendum)
Patient ID: [name], female   DOB: 01-08-1976, 44 y.o.   MRN: [id number] Doing well  Medicated for Foley placement  Vitals:   04/11/20 2014 04/11/20 2108 04/11/20 2141 04/11/20 2205  BP: (!) 99/48 110/60  97/66  Pulse: 85 80  65  Resp: 18 18 18 18   Temp:      TempSrc:      Weight:      Height:       FHR reactive UCs q 2 min  Dilation: 1 Effacement (%): 50 Cervical Position: Posterior Station: Ballotable Presentation: Vertex Exam by:: [email or web address], CNM  Foley placed easily but with bloody/watery fluid returning through foley  Will monitor over time Will hold Cytotec due to frequent contractions  Fetus is vertex, confirmed by Wynelle Bourgeois at bedside

## 2020-04-11 NOTE — Progress Notes (Signed)
Patient ID: [name], female   DOB: [birth year], 44 y.o.   MRN: [id number]   Doing well  Vitals:   04/11/20 1604 04/11/20 1807 04/11/20 1917 04/11/20 2014  BP: (!) 90/45 114/67 (!) 110/48 (!) 99/48  Pulse: 89 93 88 85  Resp:   18 18  Temp: 98.9 F (37.2 C)  98.2 F (36.8 C)   TempSrc: Oral  Oral   Weight:      Height:       FHR reassuring UCs irregular, mild  Will check cervix and attempt to place Foley at 2115

## 2020-04-12 LAB — RPR: RPR Ser Ql: NONREACTIVE

## 2020-04-12 MED ORDER — TERBUTALINE SULFATE 1 MG/ML IJ SOLN: 0.2500 mg | Freq: Once | INTRAMUSCULAR | Status: DC | PRN

## 2020-04-12 MED ORDER — EPHEDRINE 5 MG/ML INJ: 10.0000 mg | INTRAVENOUS | Status: DC | PRN

## 2020-04-12 MED ORDER — FENTANYL-BUPIVACAINE-NACL 0.5-0.125-0.9 MG/250ML-% EP SOLN
12.0000 mL/h | EPIDURAL | Status: DC | PRN
  Filled 2020-04-12: qty 250

## 2020-04-12 MED ORDER — PHENYLEPHRINE 40 MCG/ML (10ML) SYRINGE FOR IV PUSH (FOR BLOOD PRESSURE SUPPORT)
80.0000 ug | PREFILLED_SYRINGE | INTRAVENOUS | Status: DC | PRN
  Filled 2020-04-12: qty 10

## 2020-04-12 MED ORDER — DIPHENHYDRAMINE HCL 50 MG/ML IJ SOLN: 12.5000 mg | INTRAMUSCULAR | Status: DC | PRN

## 2020-04-12 MED ORDER — PHENYLEPHRINE 40 MCG/ML (10ML) SYRINGE FOR IV PUSH (FOR BLOOD PRESSURE SUPPORT)
80.0000 ug | PREFILLED_SYRINGE | INTRAVENOUS | Status: DC | PRN
  Administered 2020-04-13: 80 ug via INTRAVENOUS

## 2020-04-12 MED ORDER — OXYTOCIN-SODIUM CHLORIDE 30-0.9 UT/500ML-% IV SOLN
1.0000 m[IU]/min | INTRAVENOUS | Status: DC
  Administered 2020-04-12 (×2): 2 m[IU]/min via INTRAVENOUS
  Filled 2020-04-12: qty 500

## 2020-04-12 MED ORDER — LACTATED RINGERS IV SOLN
500.0000 mL | Freq: Once | INTRAVENOUS | Status: AC
  Administered 2020-04-12: 500 mL via INTRAVENOUS

## 2020-04-12 NOTE — Progress Notes (Signed)
Patient ID: [name], female   DOB: [birth year], 44 y.o.   MRN: [id number] Vitals:   04/12/20 0005 04/12/20 0014 04/12/20 0050 04/12/20 0109  BP:  (!) 98/54  (!) 110/54  Pulse:  79  75  Resp: 18  18 18   Temp: 98.3 F (36.8 C)     TempSrc: Oral     Weight:      Height:       FHR currently nonreactive UCs q41min  Dilation: 3.5 Effacement (%): 50 Cervical Position: Posterior Station: Ballotable Presentation: Vertex (Confirmed with ultra sound by 1m) Exam by:: [email or web address], RN  Will start Pitocin by Low Dose Protocol

## 2020-04-12 NOTE — Progress Notes (Addendum)
LABOR PROGRESS NOTE  [name] is a 44 y.o. [id number] at [redacted]w[redacted]d  admitted for eIOL 2/2 fetal lie.   Subjective: Doing well. Resting comfortably.   Objective: BP (!) 100/57   Pulse (!) 105   Temp 97.7 F (36.5 C) (Oral)   Resp 16   Ht 4\' 11"  (1.499 m)   Wt 81.7 kg   LMP 07/22/2019 (Approximate)   BMI 36.36 kg/m  or  Vitals:   04/12/20 0501 04/12/20 0649 04/12/20 0653 04/12/20 0701  BP: 104/64  107/63 (!) 100/57  Pulse: 78  82 (!) 105  Resp:  18 18 16   Temp:      TempSrc:      Weight:      Height:       Dilation: 1.5 Effacement (%): 20 Cervical Position: Posterior Station: -3 Presentation: Vertex (u/s by Dr 04/14/20) Exam by:: dr Salvadore Dom: Overall poor tracing; baseline rate 150 bpm, moderate varibility, 15 x 15 acel, no decel Toco: 4 mins  Labs: Lab Results  Component Value Date   WBC 6.9 04/11/2020   HGB 10.1 (L) 04/11/2020   HCT 30.8 (L) 04/11/2020   MCV 96.3 04/11/2020   PLT 206 04/11/2020   Patient Active Problem List   Diagnosis Date Noted  . Supervision of low-risk pregnancy, third trimester 04/11/2020  . Indication for care in labor and delivery, antepartum 04/11/2020    Assessment / Plan: 44 y.o. 04/13/2020 at [redacted]w[redacted]d here for eIOL 2/2 fetal lie. Fetal lie confirmed vertex by me on [id number].   Labor: s/p cytotec x2, FB. Pit started 0146, turned off now. Cervix not dilated as previously thought. Repeat buccal cytotec. Consider repeat FB placement.  Fetal Wellbeing: Cat I Pain Control:  Maternally supported, planning epidural Anticipated MOD: Vaginal  Elihu Milstein Autry-Lott, DO 04/12/2020, 9:14 AM PGY-2, [facility]

## 2020-04-12 NOTE — Progress Notes (Addendum)
Labor Progress Note [name] is a 44 y.o. [phone] at [redacted]w[redacted]d presented for eIOL S: Patient doing well, no concerns at this time. Husband present at bedside.    O:  BP 115/72   Pulse 70   Temp 98.5 F (36.9 C) (Oral)   Resp 16   Ht 4\' 11"  (1.499 m)   Wt 81.7 kg   LMP 07/22/2019 (Approximate)   BMI 36.36 kg/m  EFM: 140 bpm/accelerations without decelerations/intermittent contract  CVE: Dilation: 5 Effacement (%): 20, 30 Cervical Position: Anterior Station: -3 Presentation: Vertex Exam by:: Dr [email or web address]  General: well-appearing, alert, cooperative, in no acute distress Chest: normal respiratory effort Abdomen: soft, nontender, gravid   A&P: 44 y.o. 45 [redacted]w[redacted]d presenting for eIOL #Labor: Progressing well. Cytotec x2.  #Pain: epidural #FWB: category 1 #GBS negative #Depression: not on medication currently    Anupa Ganta, DO 9:53 PM  I have seen this patient and agree with the above resident's note.  LEFTWICH-KIRBY, Joellyn Grandt Certified Nurse-Midwife

## 2020-04-12 NOTE — Progress Notes (Signed)
Patient ID: [name], female   DOB: 1976/04/25, 44 y.o.   MRN: [id number] Foley came out  Vitals:   04/11/20 2014 04/11/20 2108 04/11/20 2141 04/11/20 2205  BP: (!) 99/48 110/60  97/66  Pulse: 85 80  65  Resp: 18 18 18 18   Temp:      TempSrc:      Weight:      Height:       FHR reassuring UCs every 2-3 min  Dilation: 3.5 Effacement (%): 50 Cervical Position: Posterior Station: Ballotable Presentation: Vertex Exam by:: [email or web address], RN  Consider adding Pitocin Offered epidural

## 2020-04-12 NOTE — Progress Notes (Signed)
LABOR PROGRESS NOTE  [name] is a 44 y.o. [id number] at [redacted]w[redacted]d  admitted for eIOL.   Subjective: Doing well, resting comfortably.   Objective: BP 108/69   Pulse 82   Temp 99.3 F (37.4 C) (Oral)   Resp 16   Ht 4\' 11"  (1.499 m)   Wt 81.7 kg   LMP 07/22/2019 (Approximate)   BMI 36.36 kg/m  or  Vitals:   04/12/20 1310 04/12/20 1320 04/12/20 1401 04/12/20 1505  BP: 101/62 104/69 112/68 108/69  Pulse: 82 75 82 82  Resp:    16  Temp:    99.3 F (37.4 C)  TempSrc:    Oral  Weight:      Height:       Dilation: 5 Effacement (%): 20, 30 Cervical Position: Anterior Station: -3 Presentation: Vertex Exam by:: Dr [email or web address] FHT: baseline rate 140 bpm, moderate varibility, 15 x 15 acel, no decel Toco: minimal  Labs: Lab Results  Component Value Date   WBC 6.9 04/11/2020   HGB 10.1 (L) 04/11/2020   HCT 30.8 (L) 04/11/2020   MCV 96.3 04/11/2020   PLT 206 04/11/2020   Patient Active Problem List   Diagnosis Date Noted  . Supervision of low-risk pregnancy, third trimester 04/11/2020  . Indication for care in labor and delivery, antepartum 04/11/2020   Assessment / Plan: 44 y.o. 45 at [redacted]w[redacted]d here for eIOL.   Labor: Making cervical change slowly. S/p cytotec/FB. Restart Pit. Consider AROM when appropriate.  Fetal Wellbeing: Cat I Pain Control:  Maternally supported and IV pain med PRN Anticipated MOD: Vaginal  Gionni Freese Autry-Lott, DO 04/12/2020, 6:00 PM PGY-2, [facility]

## 2020-04-12 NOTE — Progress Notes (Signed)
Doing well, pain still mild Vitals:   04/12/20 0501 04/12/20 0649 04/12/20 0653 04/12/20 0701  BP: 104/64  107/63 (!) 100/57  Pulse: 78  82 (!) 105  Resp:  18 18   Temp:      TempSrc:      Weight:      Height:       FHR reassuring UCs q 2-3 min  Dilation: 4 Effacement (%): 50 Cervical Position: Posterior Station: -3 Presentation: Vertex Exam by:: Jacqulyn Liner, RN @6am   Was contracting too much for more cytotec, though cervix still somewhat thick So we will continue Pitocin

## 2020-04-12 NOTE — Progress Notes (Signed)
LABOR PROGRESS NOTE  [name] is a 44 y.o. [id number] at [redacted]w[redacted]d  admitted for eIOL.   Subjective: Doing well. Intermittent cramping with contractions.   Objective: BP 101/62   Pulse 82   Temp 97.7 F (36.5 C) (Oral)   Resp 16   Ht 4\' 11"  (1.499 m)   Wt 81.7 kg   LMP 07/22/2019 (Approximate)   BMI 36.36 kg/m  or  Vitals:   04/12/20 0653 04/12/20 0701 04/12/20 0834 04/12/20 1310  BP: 107/63 (!) 100/57 (!) 95/54 101/62  Pulse: 82 (!) 105 81 82  Resp: 18 16    Temp:      TempSrc:      Weight:      Height:       Dilation: 4 Effacement (%): 20, 30 Cervical Position: Middle Station: -3 Presentation: Vertex Exam by:: Dr [email or web address]: baseline rate 135 bpm, moderate varibility, 15 x 15 acel, no decels Toco: 7 mins  Labs: Lab Results  Component Value Date   WBC 6.9 04/11/2020   HGB 10.1 (L) 04/11/2020   HCT 30.8 (L) 04/11/2020   MCV 96.3 04/11/2020   PLT 206 04/11/2020   Patient Active Problem List   Diagnosis Date Noted  . Supervision of low-risk pregnancy, third trimester 04/11/2020  . Indication for care in labor and delivery, antepartum 04/11/2020   Assessment / Plan: 44 y.o. 44 at [redacted]w[redacted]d here for eIOL.   Labor: Making cervical change. S/p cytotec x2, FB. Dose another cytotec and consider pitocin with continued effacement.  Fetal Wellbeing:  Cat I Pain Control:  Maternally supported Anticipated MOD: Vaginal  [redacted]w[redacted]d, DO 04/12/2020, 1:29 PM PGY-2, [facility]

## 2020-04-12 NOTE — Progress Notes (Signed)
[name] is a 44 y.o. [id number] at [redacted]w[redacted]d by LMP admitted for induction of labor due to Elective at term.  Subjective: Pt feeling painful contractions, husband in room for support.   Objective: BP 119/68   Pulse 84   Temp 98.5 F (36.9 C) (Oral)   Resp 18   Ht 4\' 11"  (1.499 m)   Wt 81.7 kg   LMP 07/22/2019 (Approximate)   BMI 36.36 kg/m  No intake/output data recorded. No intake/output data recorded.  FHT:  FHR: 140 bpm, variability: moderate with periods of minimal after IV pain medicatin,  accelerations:  Present,  decelerations:  Absent UC:   regular, every 2 minutes SVE:   Dilation: 6 Effacement (%): 70 Station: -2 Exam by:: [email or web address], CNM AROM with clear fluid, pt tolerated well  Labs: Lab Results  Component Value Date   WBC 6.9 04/11/2020   HGB 10.1 (L) 04/11/2020   HCT 30.8 (L) 04/11/2020   MCV 96.3 04/11/2020   PLT 206 04/11/2020    Assessment / Plan: Induction of labor due to elective,  progressing well on pitocin  S/P AROM @ 2300  Labor: Progressing normally Preeclampsia:  n/a Fetal Wellbeing:  Category I Pain Control:  IV pain meds I/D:  GBS neg Anticipated MOD:  NSVD  04/13/2020 04/12/2020, 11:23 PM

## 2020-04-13 ENCOUNTER — Inpatient Hospital Stay (HOSPITAL_COMMUNITY): Payer: Medicaid Other | Admitting: Anesthesiology

## 2020-04-13 ENCOUNTER — Encounter (HOSPITAL_COMMUNITY): Payer: Self-pay | Admitting: Family Medicine

## 2020-04-13 MED ORDER — COCONUT OIL OIL: 1.0000 "application " | TOPICAL_OIL | Status: DC | PRN

## 2020-04-13 MED ORDER — MAGNESIUM HYDROXIDE 400 MG/5ML PO SUSP: 30.0000 mL | ORAL | Status: DC | PRN

## 2020-04-13 MED ORDER — IBUPROFEN 600 MG PO TABS
600.0000 mg | ORAL_TABLET | Freq: Four times a day (QID) | ORAL | Status: DC
  Administered 2020-04-13 – 2020-04-14 (×4): 600 mg via ORAL
  Filled 2020-04-13 (×5): qty 1

## 2020-04-13 MED ORDER — DIPHENHYDRAMINE HCL 25 MG PO CAPS: 25.0000 mg | ORAL_CAPSULE | Freq: Four times a day (QID) | ORAL | Status: DC | PRN

## 2020-04-13 MED ORDER — SENNOSIDES-DOCUSATE SODIUM 8.6-50 MG PO TABS
2.0000 | ORAL_TABLET | ORAL | Status: DC
  Administered 2020-04-13: 2 via ORAL
  Filled 2020-04-13: qty 2

## 2020-04-13 MED ORDER — DIBUCAINE (PERIANAL) 1 % EX OINT: 1.0000 "application " | TOPICAL_OINTMENT | CUTANEOUS | Status: DC | PRN

## 2020-04-13 MED ORDER — MEASLES, MUMPS & RUBELLA VAC IJ SOLR: 0.5000 mL | Freq: Once | INTRAMUSCULAR | Status: DC

## 2020-04-13 MED ORDER — SILVER NITRATE-POT NITRATE 75-25 % EX MISC
CUTANEOUS | Status: AC
  Filled 2020-04-13: qty 10

## 2020-04-13 MED ORDER — ACETAMINOPHEN 325 MG PO TABS: 650.0000 mg | ORAL_TABLET | ORAL | Status: DC | PRN

## 2020-04-13 MED ORDER — ONDANSETRON HCL 4 MG PO TABS: 4.0000 mg | ORAL_TABLET | ORAL | Status: DC | PRN

## 2020-04-13 MED ORDER — PRENATAL MULTIVITAMIN CH
1.0000 | ORAL_TABLET | Freq: Every day | ORAL | Status: DC
  Administered 2020-04-13: 1 via ORAL
  Filled 2020-04-13 (×2): qty 1

## 2020-04-13 MED ORDER — TETANUS-DIPHTH-ACELL PERTUSSIS 5-2.5-18.5 LF-MCG/0.5 IM SUSY: 0.5000 mL | PREFILLED_SYRINGE | Freq: Once | INTRAMUSCULAR | Status: DC

## 2020-04-13 MED ORDER — SIMETHICONE 80 MG PO CHEW: 80.0000 mg | CHEWABLE_TABLET | ORAL | Status: DC | PRN

## 2020-04-13 MED ORDER — SODIUM CHLORIDE (PF) 0.9 % IJ SOLN
INTRAMUSCULAR | Status: DC | PRN
  Administered 2020-04-13: 12 mL/h via EPIDURAL

## 2020-04-13 MED ORDER — BENZOCAINE-MENTHOL 20-0.5 % EX AERO: 1.0000 "application " | INHALATION_SPRAY | CUTANEOUS | Status: DC | PRN

## 2020-04-13 MED ORDER — WITCH HAZEL-GLYCERIN EX PADS: 1.0000 "application " | MEDICATED_PAD | CUTANEOUS | Status: DC | PRN

## 2020-04-13 MED ORDER — ONDANSETRON HCL 4 MG/2ML IJ SOLN: 4.0000 mg | INTRAMUSCULAR | Status: DC | PRN

## 2020-04-13 MED ORDER — LIDOCAINE HCL (PF) 1 % IJ SOLN
INTRAMUSCULAR | Status: DC | PRN
  Administered 2020-04-13 (×2): 5 mL via EPIDURAL

## 2020-04-13 NOTE — Anesthesia Preprocedure Evaluation (Signed)
Anesthesia Evaluation  Patient identified by MRN, date of birth, ID band Patient awake    Reviewed: Allergy & Precautions, H&P , NPO status , Patient's Chart, lab work & pertinent test results  History of Anesthesia Complications Negative for: history of anesthetic complications  Airway Mallampati: II  TM Distance: >3 FB Neck ROM: full    Dental no notable dental hx. (+) Teeth Intact   Pulmonary neg pulmonary ROS, former smoker,    Pulmonary exam normal breath sounds clear to auscultation       Cardiovascular negative cardio ROS Normal cardiovascular exam Rhythm:regular Rate:Normal     Neuro/Psych negative neurological ROS  negative psych ROS   GI/Hepatic negative GI ROS, Neg liver ROS,   Endo/Other  negative endocrine ROS  Renal/GU negative Renal ROS  negative genitourinary   Musculoskeletal   Abdominal (+) + obese,   Peds  Hematology  (+) Blood dyscrasia, anemia ,   Anesthesia Other Findings   Reproductive/Obstetrics (+) Pregnancy                             Anesthesia Physical Anesthesia Plan  ASA: II  Anesthesia Plan: Epidural   Post-op Pain Management:    Induction:   PONV Risk Score and Plan:   Airway Management Planned:   Additional Equipment:   Intra-op Plan:   Post-operative Plan:   Informed Consent: I have reviewed the patients History and Physical, chart, labs and discussed the procedure including the risks, benefits and alternatives for the proposed anesthesia with the patient or authorized representative who has indicated his/her understanding and acceptance.       Plan Discussed with:   Anesthesia Plan Comments:         Anesthesia Quick Evaluation

## 2020-04-13 NOTE — Anesthesia Procedure Notes (Signed)
Epidural Patient location during procedure: OB Start time: 04/13/2020 12:04 AM End time: 04/13/2020 12:14 AM  Staffing Anesthesiologist: Leonides Grills, MD Performed: anesthesiologist   Preanesthetic Checklist Completed: patient identified, IV checked, site marked, risks and benefits discussed, monitors and equipment checked, pre-op evaluation and timeout performed  Epidural Patient position: sitting Prep: DuraPrep Patient monitoring: heart rate, cardiac monitor, continuous pulse ox and blood pressure Approach: midline Location: L4-L5 Injection technique: LOR air  Needle:  Needle type: Tuohy  Needle gauge: 17 G Needle length: 9 cm Needle insertion depth: 5 cm Catheter type: closed end flexible Catheter size: 19 Gauge Catheter at skin depth: 10 cm Test dose: negative and Other  Assessment Events: blood not aspirated, injection not painful, no injection resistance and negative IV test  Additional Notes Informed consent obtained prior to proceeding including risk of failure, 1% risk of PDPH, risk of minor discomfort and bruising. Discussed alternatives to epidural analgesia and patient desires to proceed.  Timeout performed pre-procedure verifying patient name, procedure, and platelet count.  Patient tolerated procedure well. Reason for block:procedure for pain

## 2020-04-13 NOTE — Anesthesia Postprocedure Evaluation (Signed)
Anesthesia Post Note  Patient: [name]  Procedure(s) Performed: AN AD HOC LABOR EPIDURAL     Patient location during evaluation: Mother Baby Anesthesia Type: Epidural Level of consciousness: awake and alert and oriented Pain management: satisfactory to patient Vital Signs Assessment: post-procedure vital signs reviewed and stable Respiratory status: respiratory function stable Cardiovascular status: stable Postop Assessment: no headache, no backache, epidural receding, patient able to bend at knees, no signs of nausea or vomiting and adequate PO intake Anesthetic complications: no   No complications documented.  Last Vitals:  Vitals:   04/13/20 0948 04/13/20 1053  BP: 109/66 106/67  Pulse: 76 77  Resp: 17 16  Temp: 36.7 C 36.8 C  SpO2: 100% 100%    Last Pain:  Vitals:   04/13/20 1053  TempSrc: Oral  PainSc: 0-No pain   Pain Goal: Patients Stated Pain Goal: 7 (04/13/20 0948)                 Karleen Dolphin

## 2020-04-13 NOTE — Lactation Note (Signed)
This note was copied from a baby's chart. Lactation Consultation Note  Patient Name: Boy Evelisse Szalkowski DEYCX'K Date: 04/13/2020 Reason for consult: Initial assessment;Term P4, 10 hour term female infant. 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 Per mom, infant had two stools since birth. Per mom, she did not breastfeed her other children long only two days due painful latches, her 3 rd child is 42 years old now. Mom is active on the [facility] Program in [place], [place] gave mom hand pump and explained how to use , prn use. Per mom, this will be infant's 2nd time latching at the breast, he breastfeed in L&D for 30 minutes and since then he has been sleeping. LC discussed with mom infant's small tummy size the first days of life and infant will feed frequently, LC asked mom to watch infant's cues for hunger, BF 8 to 12+ times, STS within 24 hours. LC discussed ways mom can keep infant stimulated to breastfeed by gently rubbing infant's shoulder and neck, taking to infant and doing breast compression. LC discussed hand expression and mom expressed 1 ml of colostrum on the spoon that was spoon fed to infant, afterwards infant became more alert and started cuing to breastfeed.  Mom latched infant infant on her left breast using the football hold position, infant latched with depth, swallows observed, infant was still breastfeeding after 10 minutes when LC left the room. Mom understands to call RN or LC if she has questions, concerns or needs further assistance with latching infant at the breast.  LC discussed with mom to attend [place] [facility] ( free) after hospital discharge within the community. Mom made aware of O/P services, breastfeeding support groups, community resources, and our phone # for post-discharge questions.   Maternal Data Formula Feeding for Exclusion: No Has patient been taught Hand Expression?: Yes Does the patient have breastfeeding experience  prior to this delivery?: Yes  Feeding Feeding Type: Breast Fed  LATCH Score Latch: Grasps breast easily, tongue down, lips flanged, rhythmical sucking.  Audible Swallowing: Spontaneous and intermittent  Type of Nipple: Everted at rest and after stimulation  Comfort (Breast/Nipple): Soft / non-tender  Hold (Positioning): Assistance needed to correctly position infant at breast and maintain latch.  LATCH Score: 9  Interventions Interventions: Breast feeding basics reviewed;Assisted with latch;Skin to skin;Breast massage;Hand express;Expressed milk;Position options;Support pillows;Hand pump;Adjust position;Breast compression  Lactation Tools Discussed/Used Tools: Flanges Flange Size: 27 WIC Program: Yes Pump Review: Setup, frequency, and cleaning;Milk Storage Initiated by:: Danelle Earthly, IBCLC Date initiated:: 04/13/20   Consult Status Consult Status: Follow-up Date: 04/14/20 Follow-up type: In-patient    Danelle Earthly 04/13/2020, 5:51 PM

## 2020-04-13 NOTE — Discharge Summary (Signed)
Postpartum Discharge Summary  Date of Service updated 04/14/20     Patient Name: [name] DOB: 12-04-1975 MRN: [id number]  Date of admission: 04/11/2020 Delivery date:04/13/2020  Delivering provider: Juanna Cao T  Date of discharge: 04/14/2020  Admitting diagnosis: Supervision of low-risk pregnancy, third trimester [Z34.93] Indication for care in labor and delivery, antepartum [O75.9] Intrauterine pregnancy: [redacted]w[redacted]d     Secondary diagnosis:  Active Problems:   Supervision of low-risk pregnancy, third trimester   Indication for care in labor and delivery, antepartum   SVD (spontaneous vaginal delivery)  Additional problems: none    Discharge diagnosis: Term Pregnancy Delivered                                              Post partum procedures:n/a Augmentation: AROM, Pitocin, Cytotec and IP Foley Complications: None  Hospital course: Induction of Labor With Vaginal Delivery   44 y.o. yo [id number] at [redacted]w[redacted]d was admitted to the hospital 04/11/2020 for induction of labor.  Indication for induction: Elective.  Patient had an uncomplicated labor course as follows: Membrane Rupture Time/Date: 11:02 PM ,04/12/2020   Delivery Method:Vaginal, Spontaneous  Episiotomy: None  Lacerations:  Periurethral  Details of delivery can be found in separate delivery note.  Patient had a routine postpartum course. Patient is discharged home 04/14/20.  Newborn Data: Birth date:04/13/2020  Birth time:7:13 AM  Gender:Female  Living status:Living  Apgars:8 ,9  Weight:3345 g   Magnesium Sulfate received: No BMZ received: No Rhophylac:No MMR:No T-DaP:declined Flu: No Transfusion:No  Physical exam  Vitals:   04/13/20 1443 04/13/20 1809 04/13/20 2248 04/14/20 0535  BP: (!) 97/57 104/71 (!) 104/58 (!) 85/54  Pulse: 74 75 73 68  Resp: $Remo'16 18 18 18  'WWuQa$ Temp: 98.3 F (36.8 C) 98.4 F (36.9 C) 98.1 F (36.7 C) 97.7 F (36.5 C)  TempSrc: Oral Oral Oral Oral  SpO2: 100% 99% 99% 100%   Weight:      Height:       General: alert, cooperative and no distress Lochia: appropriate Uterine Fundus: firm Incision: N/A DVT Evaluation: No evidence of DVT seen on physical exam. Labs: Lab Results  Component Value Date   WBC 6.9 04/11/2020   HGB 10.1 (L) 04/11/2020   HCT 30.8 (L) 04/11/2020   MCV 96.3 04/11/2020   PLT 206 04/11/2020   CMP Latest Ref Rng & Units 10/26/2018  Glucose 70 - 99 mg/dL 80  BUN 6 - 20 mg/dL 11  Creatinine 0.44 - 1.00 mg/dL 0.59  Sodium 135 - 145 mmol/L 141  Potassium 3.5 - 5.1 mmol/L 4.8  Chloride 98 - 111 mmol/L 111  CO2 22 - 32 mmol/L 23  Calcium 8.9 - 10.3 mg/dL 9.1  Total Protein 6.5 - 8.1 g/dL 6.3(L)  Total Bilirubin 0.3 - 1.2 mg/dL 1.0  Alkaline Phos 38 - 126 U/L 36(L)  AST 15 - 41 U/L 30  ALT 0 - 44 U/L 18   Edinburgh Score: No flowsheet data found.   After visit meds:  Allergies as of 04/14/2020   No Known Allergies     Medication List    TAKE these medications   acetaminophen 325 MG tablet Commonly known as: Tylenol Take 2 tablets (650 mg total) by mouth every 4 (four) hours as needed (for pain scale < 4). What changed:   medication strength  how much to take  when to take this  reasons to take this   doxylamine (Sleep) 25 MG tablet Commonly known as: UNISOM Take 25 mg by mouth at bedtime as needed for sleep.   FeroSul 325 (65 FE) MG tablet Generic drug: ferrous sulfate Take 1 tablet (325 mg total) by mouth every other day. What changed: when to take this   ibuprofen 600 MG tablet Commonly known as: ADVIL Take 1 tablet (600 mg total) by mouth every 6 (six) hours.   WesTab Plus 27-1 MG Tabs Take 1 tablet by mouth daily.   Zantac 360 10 MG tablet Generic drug: famotidine Take 10 mg by mouth daily as needed for heartburn or indigestion.   ZICAM ALLERGY RELIEF NA Place 1 spray into the nose daily as needed (allergies).        Discharge home in stable condition Infant Feeding: Breast Infant  Disposition:home with mother Discharge instruction: per After Visit Summary and Postpartum booklet. Activity: Advance as tolerated. Pelvic rest for 6 weeks.  Diet: routine diet Future Appointments:No future appointments. Follow up Visit:  Follow-up Information    Department, [facility] Follow up.   Why: Follow up next week for tubal ligation consent papers, then in 4-6 weeks for postpartum visit Contact information: [address] [place] [address] [phone]                Please schedule this patient for a In person postpartum visit in 4 weeks with the following provider: Any provider. Additional Postpartum F/U:n/a  Low risk pregnancy complicated by: n/a Delivery mode:  Vaginal, Spontaneous  Anticipated Birth Control:  Plans Interval BTL   04/14/2020 Fatima Blank, CNM

## 2020-04-14 DIAGNOSIS — Z8759 Personal history of other complications of pregnancy, childbirth and the puerperium: Secondary | ICD-10-CM

## 2020-04-14 MED ORDER — IBUPROFEN 600 MG PO TABS: 600.0000 mg | ORAL_TABLET | Freq: Four times a day (QID) | ORAL | 0 refills | Status: DC

## 2020-04-14 MED ORDER — ACETAMINOPHEN 325 MG PO TABS: 650.0000 mg | ORAL_TABLET | ORAL | 0 refills | Status: AC | PRN

## 2020-04-14 MED ORDER — FEROSUL 325 (65 FE) MG PO TABS: 325.0000 mg | ORAL_TABLET | ORAL | 0 refills | Status: DC

## 2020-04-14 NOTE — Discharge Instructions (Signed)
Surgery to Prevent Pregnancy Female sterilization is surgery to prevent pregnancy. In this surgery, the fallopian tubes are either blocked or closed off. When the fallopian tubes are closed, the eggs that the ovaries release cannot enter the uterus, sperm cannot reach the eggs, and you cannot get pregnant. Sterilization is permanent. It should only be done if you are sure that you do not want to be able to have children. What are the sterilization surgery options? There are several kinds of female sterilization surgeries. They include:  Laparoscopic tubal ligation. In this surgery, the fallopian tubes are tied off, sealed with heat, or blocked with a clip, ring, or clamp. A small portion of each fallopian tube may also be removed. This surgery is done through several small cuts (incisions) with special instruments that are inserted into your abdomen.  Postpartum tubal ligation. This is also called a mini-laparotomy. This surgery is done right after childbirth or 1 or 2 days after childbirth. In this surgery, the fallopian tubes are tied off, sealed with heat, or blocked with a clip, ring, or clamp. A small portion of each fallopian tube may also be removed. The surgery is done through a single incision in the abdomen.  Tubal ligation during a C-section. In this surgery, the fallopian tubes are tied off, sealed with heat, or blocked with a clip, ring, or clamp. A small portion of each fallopian tube may also be removed. The surgery is done at the same time as a C-section delivery. Is sterilization safe? Generally, sterilization is safe. Complications are rare. However, there are risks. They include:  Bleeding.  Infection.  Reaction to medicine used during the procedure.  Injury to surrounding organs.  Failure of the procedure. How effective is sterilization? Sterilization is nearly 100% effective, but it can fail. In rare cases, the fallopian tubes can grow back together over time. If this  happens, pregnancy may be possible and you will be able to get pregnant again. Women who have had this procedure have a higher chance of having an ectopic pregnancy. An ectopic pregnancy is a pregnancy that happens outside of the uterus. This kind of pregnancy can lead to serious bleeding if it is not treated. What are the benefits?  It is usually effective for a lifetime.  It is usually safe.  It does not have the drawbacks of other types of birth control in that your hormones are not affected. Because of this, your menstrual periods, sexual desire, and sexual performance will not be affected. What are the drawbacks?  You will need to recover and may have complications after surgery.  If you change your mind and decide that you want to have children, you may not be able to. Sterilization may be reversed, but a reversal is not always successful.  It does not provide protection against STDs (sexually transmitted diseases).  It increases the chance of having an ectopic pregnancy. Follow these instructions at home:  Keep all follow-up visits as told by your health care provider. This is important. Summary  Female sterilization is surgery to prevent pregnancy.  There are different types of female sterilization surgeries.  Sterilization may be reversed, but a reversal is not always successful.  Sterilization does not protect against STDs. This information is not intended to replace advice given to you by your health care provider. Make sure you discuss any questions you have with your health care provider. Document Revised: 10/21/2018 Document Reviewed: 01/16/2018 Elsevier Patient Education  2020 Elsevier Inc.  Postpartum Care After  Vaginal Delivery This sheet gives you information about how to care for yourself from the time you deliver your baby to up to 6-12 weeks after delivery (postpartum period). Your health care provider may also give you more specific instructions. If you have  problems or questions, contact your health care provider. Follow these instructions at home: Vaginal bleeding  It is normal to have vaginal bleeding (lochia) after delivery. Wear a sanitary pad for vaginal bleeding and discharge. ? During the first week after delivery, the amount and appearance of lochia is often similar to a menstrual period. ? Over the next few weeks, it will gradually decrease to a dry, yellow-brown discharge. ? For most women, lochia stops completely by 4-6 weeks after delivery. Vaginal bleeding can vary from woman to woman.  Change your sanitary pads frequently. Watch for any changes in your flow, such as: ? A sudden increase in volume. ? A change in color. ? Large blood clots.  If you pass a blood clot from your vagina, save it and call your health care provider to discuss. Do not flush blood clots down the toilet before talking with your health care provider.  Do not use tampons or douches until your health care provider says this is safe.  If you are not breastfeeding, your period should return 6-8 weeks after delivery. If you are feeding your child breast milk only (exclusive breastfeeding), your period may not return until you stop breastfeeding. Perineal care  Keep the area between the vagina and the anus (perineum) clean and dry as told by your health care provider. Use medicated pads and pain-relieving sprays and creams as directed.  If you had a cut in the perineum (episiotomy) or a tear in the vagina, check the area for signs of infection until you are healed. Check for: ? More redness, swelling, or pain. ? Fluid or blood coming from the cut or tear. ? Warmth. ? Pus or a bad smell.  You may be given a squirt bottle to use instead of wiping to clean the perineum area after you go to the bathroom. As you start healing, you may use the squirt bottle before wiping yourself. Make sure to wipe gently.  To relieve pain caused by an episiotomy, a tear in the  vagina, or swollen veins in the anus (hemorrhoids), try taking a warm sitz bath 2-3 times a day. A sitz bath is a warm water bath that is taken while you are sitting down. The water should only come up to your hips and should cover your buttocks. Breast care  Within the first few days after delivery, your breasts may feel heavy, full, and uncomfortable (breast engorgement). Milk may also leak from your breasts. Your health care provider can suggest ways to help relieve the discomfort. Breast engorgement should go away within a few days.  If you are breastfeeding: ? Wear a bra that supports your breasts and fits you well. ? Keep your nipples clean and dry. Apply creams and ointments as told by your health care provider. ? You may need to use breast pads to absorb milk that leaks from your breasts. ? You may have uterine contractions every time you breastfeed for up to several weeks after delivery. Uterine contractions help your uterus return to its normal size. ? If you have any problems with breastfeeding, work with your health care provider or Advertising copywriter.  If you are not breastfeeding: ? Avoid touching your breasts a lot. Doing this can make your breasts  produce more milk. ? Wear a good-fitting bra and use cold packs to help with swelling. ? Do not squeeze out (express) milk. This causes you to make more milk. Intimacy and sexuality  Ask your health care provider when you can engage in sexual activity. This may depend on: ? Your risk of infection. ? How fast you are healing. ? Your comfort and desire to engage in sexual activity.  You are able to get pregnant after delivery, even if you have not had your period. If desired, talk with your health care provider about methods of birth control (contraception). Medicines  Take over-the-counter and prescription medicines only as told by your health care provider.  If you were prescribed an antibiotic medicine, take it as told by your  health care provider. Do not stop taking the antibiotic even if you start to feel better. Activity  Gradually return to your normal activities as told by your health care provider. Ask your health care provider what activities are safe for you.  Rest as much as possible. Try to rest or take a nap while your baby is sleeping. Eating and drinking   Drink enough fluid to keep your urine pale yellow.  Eat high-fiber foods every day. These may help prevent or relieve constipation. High-fiber foods include: ? Whole grain cereals and breads. ? Brown rice. ? Beans. ? Fresh fruits and vegetables.  Do not try to lose weight quickly by cutting back on calories.  Take your prenatal vitamins until your postpartum checkup or until your health care provider tells you it is okay to stop. Lifestyle  Do not use any products that contain nicotine or tobacco, such as cigarettes and e-cigarettes. If you need help quitting, ask your health care provider.  Do not drink alcohol, especially if you are breastfeeding. General instructions  Keep all follow-up visits for you and your baby as told by your health care provider. Most women visit their health care provider for a postpartum checkup within the first 3-6 weeks after delivery. Contact a health care provider if:  You feel unable to cope with the changes that your child brings to your life, and these feelings do not go away.  You feel unusually sad or worried.  Your breasts become red, painful, or hard.  You have a fever.  You have trouble holding urine or keeping urine from leaking.  You have little or no interest in activities you used to enjoy.  You have not breastfed at all and you have not had a menstrual period for 12 weeks after delivery.  You have stopped breastfeeding and you have not had a menstrual period for 12 weeks after you stopped breastfeeding.  You have questions about caring for yourself or your baby.  You pass a blood  clot from your vagina. Get help right away if:  You have chest pain.  You have difficulty breathing.  You have sudden, severe leg pain.  You have severe pain or cramping in your lower abdomen.  You bleed from your vagina so much that you fill more than one sanitary pad in one hour. Bleeding should not be heavier than your heaviest period.  You develop a severe headache.  You faint.  You have blurred vision or spots in your vision.  You have bad-smelling vaginal discharge.  You have thoughts about hurting yourself or your baby. If you ever feel like you may hurt yourself or others, or have thoughts about taking your own life, get help right away.  You can go to the nearest emergency department or call:  Your local emergency services (911 in the [place]  A suicide crisis helpline, such as the National Suicide Prevention Lifeline at 863-135-23601-(856) 778-2774. This is open 24 hours a day. Summary  The period of time right after you deliver your newborn up to 6-12 weeks after delivery is called the postpartum period.  Gradually return to your normal activities as told by your health care provider.  Keep all follow-up visits for you and your baby as told by your health care provider. This information is not intended to replace advice given to you by your health care provider. Make sure you discuss any questions you have with your health care provider. Document Revised: 05/09/2017 Document Reviewed: 02/17/2017 Elsevier Patient Education  2020 [employer]

## 2020-04-14 NOTE — Clinical Social Work Maternal (Addendum)
CLINICAL SOCIAL WORK MATERNAL/CHILD NOTE  Patient Details  Name: [name] MRN: [id number] Date of Birth: 12/09/1975  Date:  04/14/2020  Clinical Social Worker Initiating Note:  [name], LCSWA Date/Time: Initiated:  04/14/20/0900     Child's Name:  [name]   Biological Parents:  Mother, Father   Need for Interpreter:  None   Reason for Referral:  Current Substance Use/Substance Use During Pregnancy , Behavioral Health Concerns   Address:  [address] [place] [address]    Phone number:  [phone] (home)     Additional phone number:   Household Members/Support Persons (HM/SP):   Household Member/Support Person 1, Household Member/Support Person 2, Household Member/Support Person 3   HM/SP Name Relationship DOB or Age  HM/SP -1 [name] Spouse 05/08/1968  HM/SP -2 [name] Daughter 12/25/2003  HM/SP -3 [name] Daughter 07/30/2001  HM/SP -4        HM/SP -5        HM/SP -6        HM/SP -7        HM/SP -8          Natural Supports (not living in the home):  Spouse/significant other   Professional Supports: None   Employment: Unemployed   Type of Work:     Education:  High school graduate   Homebound arranged:    Financial Resources:  Medicaid   Other Resources:  WIC   Cultural/Religious Considerations Which May Impact Care:    Strengths:  Ability to meet basic needs , Pediatrician chosen, Home prepared for child    Psychotropic Medications:         Pediatrician:    [place] area  Pediatrician List:    [facility] ([address])  [place]    [place]    [place]    [place]    [place]      Pediatrician Fax Number:    Risk Factors/Current Problems:  Substance Use    Cognitive State:  Alert    Mood/Affect:  Calm , Interested    CSW Assessment: CSW consulted for hx of depression and THC use. CSW met with [name] to offer  support and complete assessment. CSW entered room and observed [name] [name] snoring on couch and [name] breast feeding newborn. CSW asked [name] if she would like to complete assessment later, [name] declined and stated [name] could remain in room. [name] reported she resides with [name] and two daughters. [name] is currently unemployed and receives WIC. [name] stated she may be interested in foods stamps and expressed she is aware to contact DSS to apply. [name] reported she has a diagnosis of depression dating back to 2019. [name] stated she was on medication for about two months when first diagnosed, which she found to be helpful. [name] was unable to recall which medication. [name] reported she has never been to therapy and expressed no interest. [name] declined currently experiencing any SI, HI or DV. [name] identified only [name] as a support. [name] expressed she had a good pregnancy and denied experiencing any depressive symptoms.  CSW provided education regarding the baby blues period vs. perinatal mood disorders, discussed treatment and gave resources for mental health follow up if concerns arise.  CSW recommends self-evaluation during the postpartum time period using the New Mom Checklist from Postpartum Progress and encouraged [name] to contact a medical professional if symptoms are noted at any time.  [name]   expressed understanding and declined having experienced PPD with any of her previous children.  CSW provided review of Sudden Infant Death Syndrome (SIDS) precautions. [name] reported newborn will sleep in a bassinet. [name] identified [facility] for follow-up and denied nay barriers to transportation. [name] stated she has all of the essential needs for newborn, including a new carseat. [name] declined any additional resources or referrals.  [name] confirmed she used THC during pregnancy. [name] reported her last use was in June or July. [name] denied using any other substances and did not give an explanation for the use. CSW informed [name] of the hospital  drug screen policy, informing [name] that an UDS and CDS is completed on newborn. [name] was informed a CPS report will be made if either drug screen results positive for substances. [name] expressed understanding and declined having any additional questions.   CSW will continue to follow UDS/CDS and make a CPS report if warranted. CSW identifies no further need for intervention and no barriers to discharge at this time.   CSW Plan/Description:  No Further Intervention Required/No Barriers to Discharge, CSW Will Continue to Monitor Umbilical Cord Tissue Drug Screen Results and Make Report if Warranted, Child Protective Service Report , Hospital Drug Screen Policy Information, Perinatal Mood and Anxiety Disorder (PMADs) Education, Sudden Infant Death Syndrome (SIDS) Education, Other Information/Referral to Community Resources    [name] [name], LCSWA 04/14/2020, 10:47 AM  

## 2020-04-14 NOTE — Lactation Note (Signed)
This note was copied from a baby's chart. Lactation Consultation Note  Patient Name: [name] DEYCX'K Date: 04/14/2020   Lc attempt to see.  Everyone sleeping Maternal Data    Feeding Feeding Type: Breast Fed  [facility] Score                   Interventions    Lactation Tools Discussed/Used     Consult Status      Joden Bonsall Michaelle Copas 04/14/2020, 1:31 PM

## 2020-04-14 NOTE — Lactation Note (Signed)
This note was copied from a baby's chart. Lactation Consultation Note  Patient Name: Boy Fae Blossom [id number]'H Date: 04/14/2020  Being d/c today. Baby boy Esguerra now 57 hours old.  Parents ready to go.  Deny need for lactation services at this time.  Urged mom to call lactation as needed.   Maternal Data    Feeding    LATCH Score                   Interventions    Lactation Tools Discussed/Used     Consult Status      Yacoub Diltz Michaelle Copas 04/14/2020, 2:56 PM

## 2021-06-13 ENCOUNTER — Ambulatory Visit: Payer: Medicaid Other | Admitting: Nurse Practitioner

## 2021-07-19 ENCOUNTER — Ambulatory Visit: Payer: Medicaid Other | Admitting: Physician Assistant

## 2021-10-30 ENCOUNTER — Ambulatory Visit: Payer: Medicaid Other | Admitting: Critical Care Medicine

## 2021-11-12 ENCOUNTER — Ambulatory Visit: Payer: Medicaid Other | Attending: Nurse Practitioner | Admitting: Internal Medicine

## 2021-11-12 ENCOUNTER — Encounter: Payer: Self-pay | Admitting: Internal Medicine

## 2021-11-12 VITALS — BP 111/74 | HR 92 | Temp 98.4°F | Resp 16 | Ht 60.0 in | Wt 134.0 lb

## 2021-11-12 DIAGNOSIS — R634 Abnormal weight loss: Secondary | ICD-10-CM | POA: Diagnosis not present

## 2021-11-12 DIAGNOSIS — L659 Nonscarring hair loss, unspecified: Secondary | ICD-10-CM

## 2021-11-12 DIAGNOSIS — Z800 Family history of malignant neoplasm of digestive organs: Secondary | ICD-10-CM

## 2021-11-12 DIAGNOSIS — N920 Excessive and frequent menstruation with regular cycle: Secondary | ICD-10-CM | POA: Insufficient documentation

## 2021-11-12 DIAGNOSIS — Z3009 Encounter for other general counseling and advice on contraception: Secondary | ICD-10-CM

## 2021-11-12 DIAGNOSIS — Z7689 Persons encountering health services in other specified circumstances: Secondary | ICD-10-CM | POA: Diagnosis not present

## 2021-11-12 DIAGNOSIS — N946 Dysmenorrhea, unspecified: Secondary | ICD-10-CM | POA: Insufficient documentation

## 2021-11-12 DIAGNOSIS — Z13220 Encounter for screening for lipoid disorders: Secondary | ICD-10-CM

## 2021-11-13 LAB — COMPREHENSIVE METABOLIC PANEL
ALT: 30 IU/L (ref 0–32)
AST: 42 IU/L — ABNORMAL HIGH (ref 0–40)
Albumin/Globulin Ratio: 2.2 (ref 1.2–2.2)
Albumin: 4.7 g/dL (ref 3.8–4.8)
Alkaline Phosphatase: 62 IU/L (ref 44–121)
BUN/Creatinine Ratio: 42 — ABNORMAL HIGH (ref 9–23)
BUN: 25 mg/dL — ABNORMAL HIGH (ref 6–24)
Bilirubin Total: 0.3 mg/dL (ref 0.0–1.2)
CO2: 23 mmol/L (ref 20–29)
Calcium: 9.4 mg/dL (ref 8.7–10.2)
Chloride: 103 mmol/L (ref 96–106)
Creatinine, Ser: 0.6 mg/dL (ref 0.57–1.00)
Globulin, Total: 2.1 g/dL (ref 1.5–4.5)
Glucose: 90 mg/dL (ref 70–99)
Potassium: 4.4 mmol/L (ref 3.5–5.2)
Sodium: 140 mmol/L (ref 134–144)
Total Protein: 6.8 g/dL (ref 6.0–8.5)
eGFR: 114 mL/min/{1.73_m2} (ref >59)

## 2021-11-13 LAB — CBC
Hematocrit: 37.6 % (ref 34.0–46.6)
Hemoglobin: 12.6 g/dL (ref 11.1–15.9)
MCH: 30.1 pg (ref 26.6–33.0)
MCHC: 33.5 g/dL (ref 31.5–35.7)
MCV: 90 fL (ref 79–97)
Platelets: 306 10*3/uL (ref 150–450)
RBC: 4.19 x10E6/uL (ref 3.77–5.28)
RDW: 13 % (ref 11.7–15.4)
WBC: 6.8 10*3/uL (ref 3.4–10.8)

## 2021-11-13 LAB — LIPID PANEL
Chol/HDL Ratio: 3.5 ratio (ref 0.0–4.4)
Cholesterol, Total: 182 mg/dL (ref 100–199)
HDL: 52 mg/dL (ref >39)
LDL Chol Calc (NIH): 98 mg/dL (ref 0–99)
Triglycerides: 184 mg/dL — ABNORMAL HIGH (ref 0–149)
VLDL Cholesterol Cal: 32 mg/dL (ref 5–40)

## 2021-11-13 LAB — TSH+T4F+T3FREE
Free T4: 1.19 ng/dL (ref 0.82–1.77)
T3, Free: 2.9 pg/mL (ref 2.0–4.4)
TSH: 0.65 u[IU]/mL (ref 0.450–4.500)

## 2021-11-13 LAB — HEMOGLOBIN A1C
Est. average glucose Bld gHb Est-mCnc: 105 mg/dL
Hgb A1c MFr Bld: 5.3 % (ref 4.8–5.6)

## 2021-11-15 ENCOUNTER — Telehealth: Payer: Self-pay | Admitting: Emergency Medicine

## 2021-11-15 ENCOUNTER — Other Ambulatory Visit: Payer: Self-pay | Admitting: Internal Medicine

## 2021-11-15 DIAGNOSIS — Z1231 Encounter for screening mammogram for malignant neoplasm of breast: Secondary | ICD-10-CM

## 2021-11-15 NOTE — Telephone Encounter (Signed)
Copied from CRM [id number]. Topic: General - Other >> Nov 15, 2021  2:49 PM [name] wrote: Reason for CRM: Pt called to go over latest lab results, please advise.

## 2021-11-16 NOTE — Telephone Encounter (Signed)
Left message on voicemail to return call.  CRM created and routed to [facility] Triage nurse pool.

## 2021-11-27 ENCOUNTER — Telehealth: Payer: Self-pay | Admitting: *Deleted

## 2021-11-27 ENCOUNTER — Ambulatory Visit
Admission: RE | Admit: 2021-11-27 | Discharge: 2021-11-27 | Disposition: A | Discharge status: Home / Self Care | Payer: Medicaid Other | Source: Ambulatory Visit | Attending: Internal Medicine | Admitting: Internal Medicine

## 2021-11-27 DIAGNOSIS — Z1231 Encounter for screening mammogram for malignant neoplasm of breast: Secondary | ICD-10-CM

## 2021-11-27 NOTE — Telephone Encounter (Signed)
Pt returning call for results. The results and provider's comments given to pt. Verbalized understanding.  Marcine Matar, MD  11/13/2021  9:54 AM EDT     Let patient know that kidney function is good.  Slight elevation in one of her liver function test which we will observe for now.  Triglyceride level elevated at 184 with goal being less than 150.  Healthy eating habits and regular exercise as discussed on recent visit will help to lower cholesterol.  Thyroid function tests normal.  Blood cell count shows no anemia.  Screen for diabetes normal.

## 2021-11-27 NOTE — Telephone Encounter (Signed)
noted 

## 2021-11-28 ENCOUNTER — Ambulatory Visit
Admission: RE | Admit: 2021-11-28 | Discharge: 2021-11-28 | Disposition: A | Discharge status: Home / Self Care | Payer: Medicaid Other | Source: Ambulatory Visit | Attending: Internal Medicine | Admitting: Internal Medicine

## 2021-11-28 DIAGNOSIS — N946 Dysmenorrhea, unspecified: Secondary | ICD-10-CM

## 2021-11-28 DIAGNOSIS — N920 Excessive and frequent menstruation with regular cycle: Secondary | ICD-10-CM

## 2021-12-20 ENCOUNTER — Other Ambulatory Visit (HOSPITAL_COMMUNITY)
Admission: RE | Admit: 2021-12-20 | Discharge: 2021-12-20 | Disposition: A | Discharge status: Home / Self Care | Payer: Medicaid Other | Source: Ambulatory Visit | Attending: Internal Medicine | Admitting: Internal Medicine

## 2021-12-20 ENCOUNTER — Encounter: Payer: Self-pay | Admitting: Internal Medicine

## 2021-12-20 ENCOUNTER — Ambulatory Visit: Payer: Medicaid Other | Attending: Internal Medicine | Admitting: Internal Medicine

## 2021-12-20 VITALS — BP 118/72 | HR 101 | Temp 98.2°F | Ht 59.0 in | Wt 132.6 lb

## 2021-12-20 DIAGNOSIS — Z124 Encounter for screening for malignant neoplasm of cervix: Secondary | ICD-10-CM | POA: Insufficient documentation

## 2021-12-20 DIAGNOSIS — Z5320 Procedure and treatment not carried out because of patient's decision for unspecified reasons: Secondary | ICD-10-CM | POA: Diagnosis not present

## 2021-12-20 NOTE — Progress Notes (Signed)
No concerns. 

## 2021-12-20 NOTE — Progress Notes (Signed)
Patient ID: [name], female    DOB: May 22, 1975  MRN: [id number]  CC: Gynecologic Exam   Subjective: [name] is a 46 y.o. female who presents for pap Her concerns today include:   GYN History:  Pt is G6P4(2 miscarriages) Any hx of abn paps?: no Menses regular or irregular?: regular How long does menses last?  4-7 days Menstrual flow light or heavy?:  last menses was normal flow.  One prior was heavy flow.  Pelvic US done since last visit was nl exam.  Method of birth control?:  none.  Wants tubal ligation.  Referred to GYN last visit but she has not been called for appt as yet Any vaginal dischg at this time?:  no Dysuria?: no Any hx of STI?:  no Sexually active with how many partners:  one female partner Desires STI screen:  yes but declines hepatitis C and HIV screening. Last MMG: 11/2021.   Family hx of uterine, cervical or breast cancer?:  no  Patient Active Problem List   Diagnosis Date Noted   Dysmenorrhea 11/12/2021   Menorrhagia with regular cycle 11/12/2021   SVD (spontaneous vaginal delivery) 04/13/2020   Supervision of low-risk pregnancy, third trimester 04/11/2020   Indication for care in labor and delivery, antepartum 04/11/2020     Current Outpatient Medications on File Prior to Visit  Medication Sig Dispense Refill   acetaminophen (TYLENOL) 325 MG tablet Take 2 tablets (650 mg total) by mouth every 4 (four) hours as needed (for pain scale < 4). 30 tablet 0   doxylamine, Sleep, (UNISOM) 25 MG tablet Take 25 mg by mouth at bedtime as needed for sleep.     famotidine (ZANTAC 360) 10 MG tablet Take 10 mg by mouth daily as needed for heartburn or indigestion.     FEROSUL 325 (65 Fe) MG tablet Take 1 tablet (325 mg total) by mouth every other day. 30 tablet 0   Homeopathic Products (ZICAM ALLERGY RELIEF NA) Place 1 spray into the nose daily as needed (allergies).     ibuprofen (ADVIL) 600 MG tablet Take 1 tablet (600 mg total) by mouth every 6 (six) hours. 30  tablet 0   Prenatal Vit-Fe Fumarate-FA (WESTAB PLUS) 27-1 MG TABS Take 1 tablet by mouth daily.     No current facility-administered medications on file prior to visit.    No Known Allergies  Social History   Socioeconomic History   Marital status: Legally Separated    Spouse name: Not on file   Number of children: 3   Years of education: Not on file   Highest education level: 12th grade  Occupational History   Occupation: Self employed - Land  Tobacco Use   Smoking status: Never   Smokeless tobacco: Never   Tobacco comments:    marijuana last week  Vaping Use   Vaping Use: Never used  Substance and Sexual Activity   Alcohol use: Not Currently   Drug use: Yes    Types: Marijuana    Comment: daily   Sexual activity: Not on file  Other Topics Concern   Not on file  Social History Narrative   Not on file   Social Determinants of Health   Financial Resource Strain: Not on file  Food Insecurity: Not on file  Transportation Needs: Not on file  Physical Activity: Not on file  Stress: Not on file  Social Connections: Not on file  Intimate Partner Violence: Not on file    Family  History  Problem Relation Age of Onset   Hyperthyroidism Mother    Colon cancer Mother    Diabetes Maternal Grandmother    Diabetes Paternal Grandfather    Breast cancer Neg Hx     Past Surgical History:  Procedure Laterality Date   CHOLECYSTECTOMY N/A 10/27/2018   Procedure: LAPAROSCOPIC CHOLECYSTECTOMY;  Surgeon: Harriette Bouillon, MD;  Location: [facility] OR;  Service: General;  Laterality: N/A;   WISDOM TOOTH EXTRACTION      ROS: Review of Systems Negative except as stated above  PHYSICAL EXAM: BP 118/72   Pulse (!) 101   Temp 98.2 F (36.8 C) (Oral)   Ht 4\' 11"  (1.499 m)   Wt 132 lb 9.6 oz (60.1 kg)   SpO2 99%   BMI 26.78 kg/m   Physical Exam  General appearance - alert, well appearing, and in no distress Mental status - normal mood, behavior, speech, dress, motor  activity, and thought processes Pelvic - CMA:  present:  normal external genitalia, vulva, vagina, cervix, uterus and adnexa      Latest Ref Rng & Units 11/12/2021    2:55 PM 10/26/2018    9:16 AM 10/16/2008   10:40 AM  CMP  Glucose 70 - 99 mg/dL 90  80  99   BUN 6 - 24 mg/dL 25  11  11    Creatinine 0.57 - 1.00 mg/dL 10/18/2008   3.53   Sodium 134 - 144 mmol/L 140  141  137   Potassium 3.5 - 5.2 mmol/L 4.4  4.8  3.3   Chloride 96 - 106 mmol/L 103  111  110   CO2 20 - 29 mmol/L 23  23  21    Calcium 8.7 - 10.2 mg/dL 9.4  9.1  8.8   Total Protein 6.0 - 8.5 g/dL 6.8  6.3  6.4   Total Bilirubin 0.0 - 1.2 mg/dL 0.3  1.0  0.7   Alkaline Phos 44 - 121 IU/L 62  36  40   AST 0 - 40 IU/L 42  30  21   ALT 0 - 32 IU/L 30  18  20     Lipid Panel     Component Value Date/Time   CHOL 182 11/12/2021 1455   TRIG 184 (H) 11/12/2021 1455   HDL 52 11/12/2021 1455   CHOLHDL 3.5 11/12/2021 1455   LDLCALC 98 11/12/2021 1455    CBC    Component Value Date/Time   WBC 6.8 11/12/2021 1455   WBC 6.9 04/11/2020 1230   RBC 4.19 11/12/2021 1455   RBC 3.20 (L) 04/11/2020 1230   HGB 12.6 11/12/2021 1455   HCT 37.6 11/12/2021 1455   PLT 306 11/12/2021 1455   MCV 90 11/12/2021 1455   MCH 30.1 11/12/2021 1455   MCH 31.6 04/11/2020 1230   MCHC 33.5 11/12/2021 1455   MCHC 32.8 04/11/2020 1230   RDW 13.0 11/12/2021 1455   LYMPHSABS 1.8 10/26/2018 0916   MONOABS 0.6 10/26/2018 0916   EOSABS 0.1 10/26/2018 0916   BASOSABS 0.0 10/26/2018 0916    ASSESSMENT AND PLAN: 1. Pap smear for cervical cancer screening - Cervicovaginal ancillary only - Cytology - PAP  2. Screening for hepatitis C declined   3. HIV screening declined   Patient was given the opportunity to ask questions.  Patient verbalized understanding of the plan and was able to repeat key elements of the plan.   This documentation was completed using 12/26/2018.  Any transcriptional errors are  unintentional.  No orders of the defined types were placed in this encounter.    Requested Prescriptions    No prescriptions requested or ordered in this encounter    No follow-ups on file.  Jonah Blue, MD, FACP

## 2021-12-24 ENCOUNTER — Telehealth: Payer: Self-pay

## 2021-12-24 ENCOUNTER — Other Ambulatory Visit: Payer: Self-pay | Admitting: Internal Medicine

## 2021-12-24 LAB — CERVICOVAGINAL ANCILLARY ONLY
Bacterial Vaginitis (gardnerella): POSITIVE — AB
Candida Glabrata: NEGATIVE
Candida Vaginitis: NEGATIVE
Chlamydia: NEGATIVE
Comment: NEGATIVE
Comment: NEGATIVE
Comment: NEGATIVE
Comment: NEGATIVE
Comment: NEGATIVE
Comment: NORMAL
Neisseria Gonorrhea: NEGATIVE
Trichomonas: NEGATIVE

## 2021-12-24 MED ORDER — METRONIDAZOLE 500 MG PO TABS: 500.0000 mg | ORAL_TABLET | Freq: Two times a day (BID) | ORAL | 0 refills | Status: DC

## 2021-12-25 LAB — CYTOLOGY - PAP
Adequacy: ABSENT
Comment: NEGATIVE
Diagnosis: NEGATIVE
High risk HPV: NEGATIVE

## 2021-12-27 ENCOUNTER — Encounter: Payer: Self-pay | Admitting: *Deleted

## 2022-01-04 NOTE — Telephone Encounter (Signed)
Opened in error

## 2022-01-14 ENCOUNTER — Telehealth: Payer: Self-pay | Admitting: *Deleted

## 2022-01-14 NOTE — Telephone Encounter (Signed)
Pt called for results, had a voicemail to return call. Gave results and provider comments as below:   Marcine Matar, MD  12/25/2021  1:36 PM EDT     Pap smear was normal but we did not get adequate cells from around the cervix.  Therefore we will need to repeat her Pap smear again in 1 year instead of 3 years.   Marcine Matar, MD  12/24/2021  1:48 PM EDT     Let patient know that screen for chlamydia, gonorrhea and trichomonas negative.  She did test positive for bacterial vaginosis.  This is an infection in the vagina but it is not sexually transmitted.  I will send a prescription to her pharmacy for medication for her to be treated.  Sent to [facility] on [address] [employer] states she has started her menstrual cycle two weeks early, wants to know if the Metronidazole could be causing that. She is almost finished, advised her to complete the metronidazole unless she is called back with different information.

## 2022-01-16 NOTE — Telephone Encounter (Signed)
Called pt and informed of pcp notes. Pt expressed understanding.------DD,RMA

## 2022-02-13 IMAGING — US US MFM OB FOLLOW-UP
1 series · 14 of 26 positions shown · non-contrast
Comparison: none

[Series 1: us mfm ob follow-up · 14 of 26 slices shown]
[im 1/26]
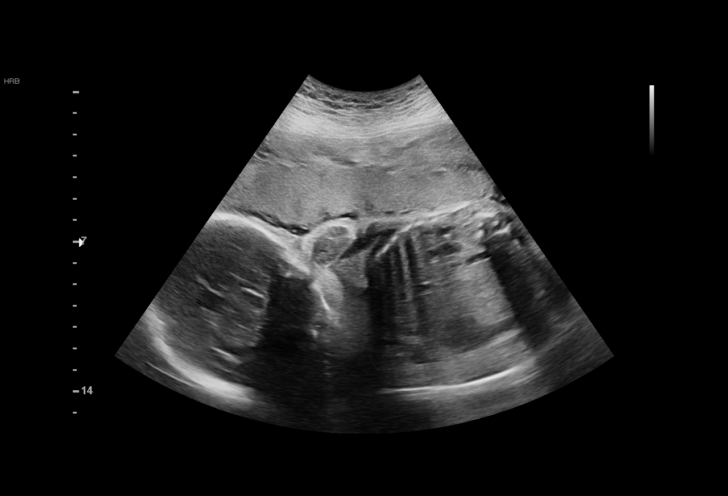
[im 3/26]
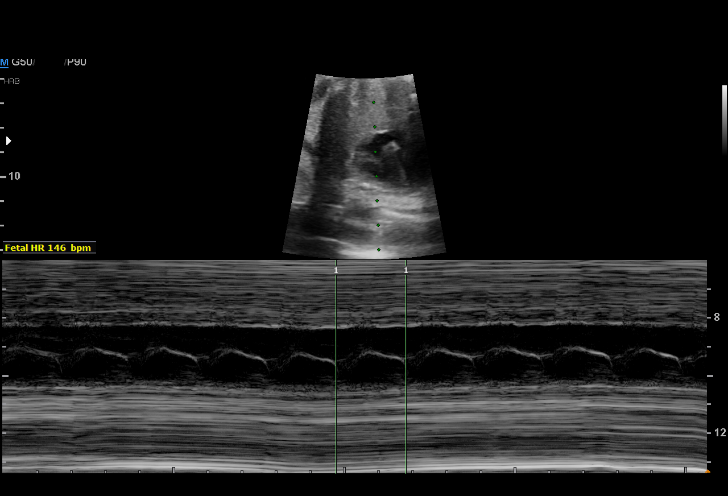
[im 5/26]
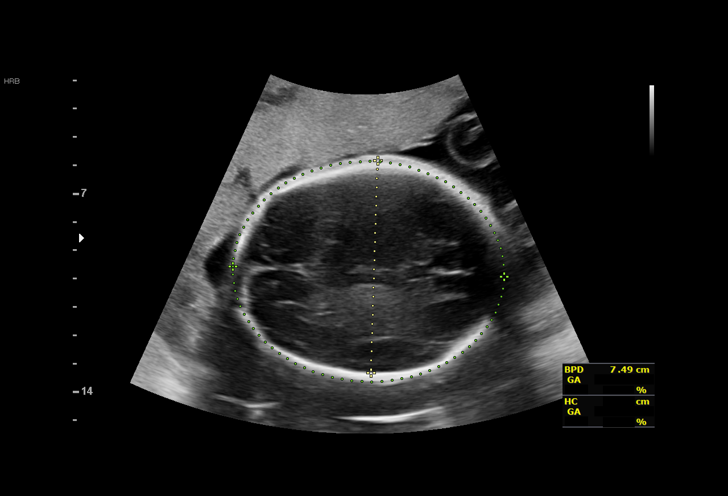
[im 7/26]
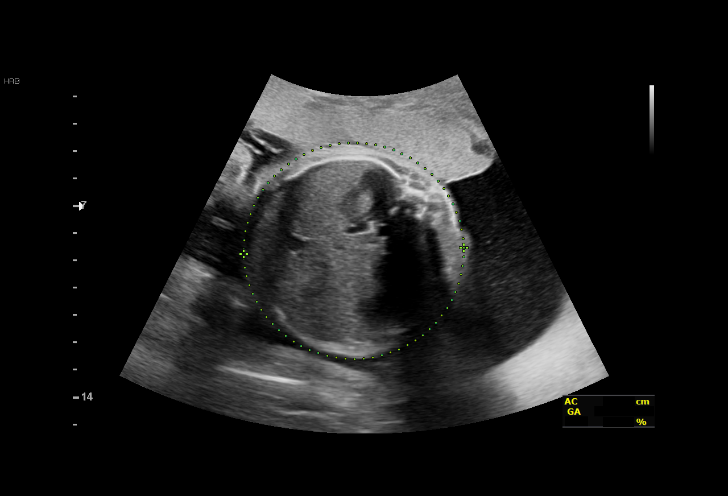
[im 9/26]
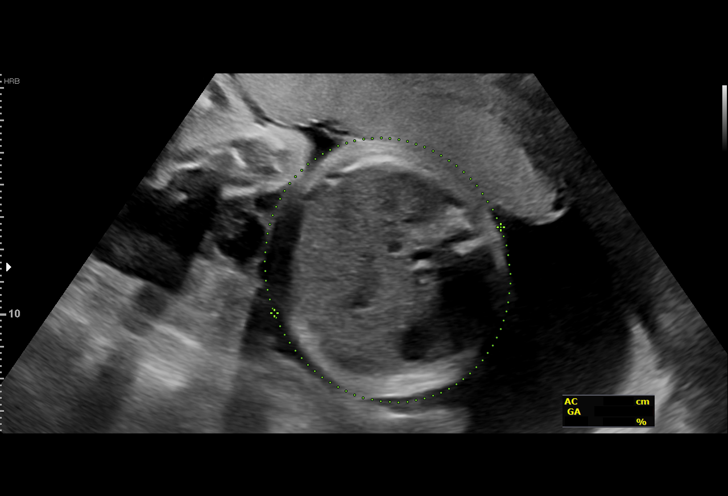
[im 11/26]
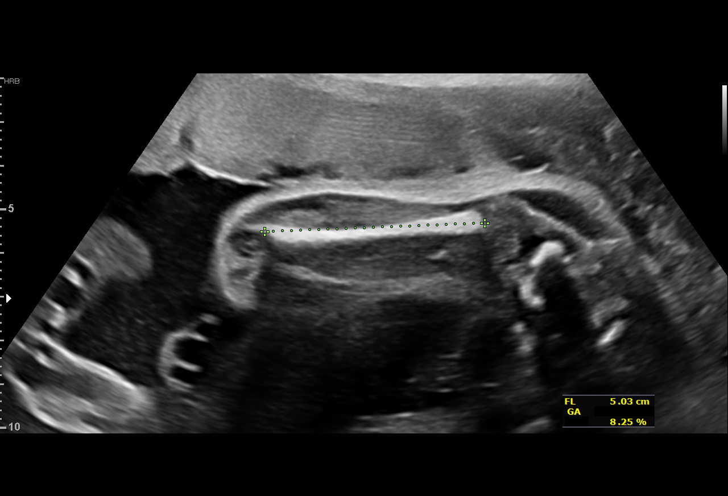
[im 13/26]
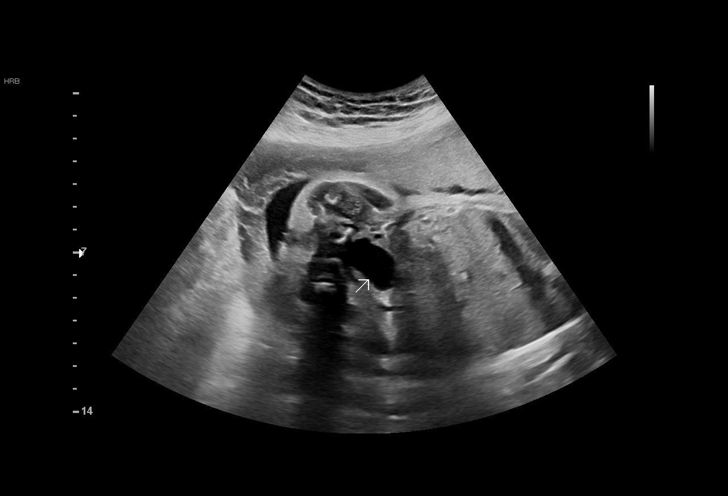
[im 14/26]
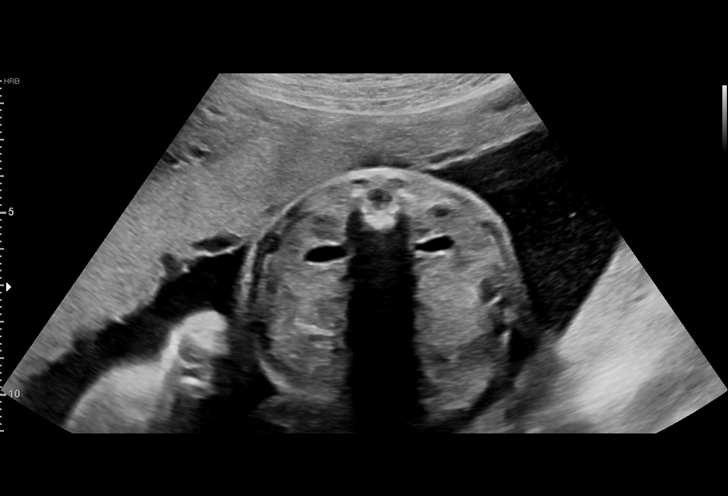
[im 16/26]
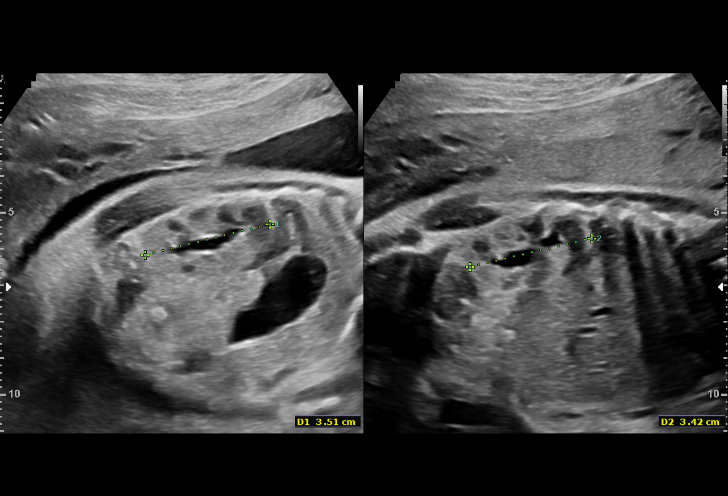
[im 18/26]
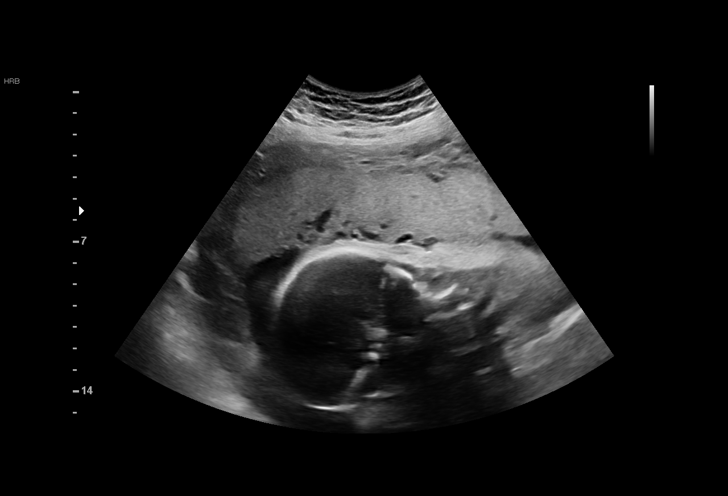
[im 20/26]
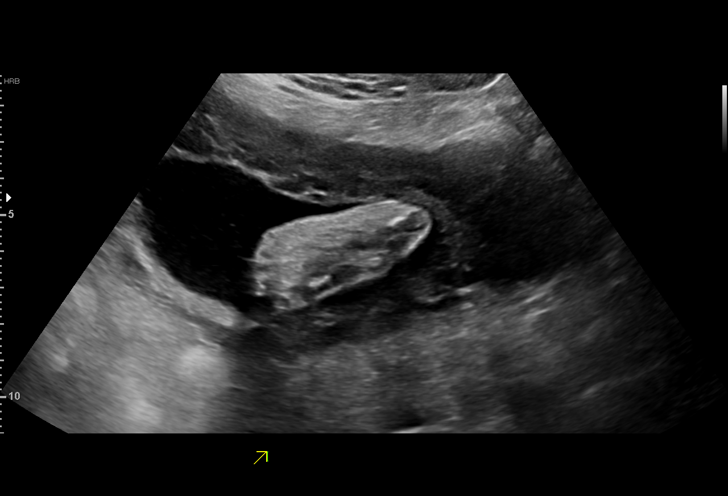
[im 22/26]
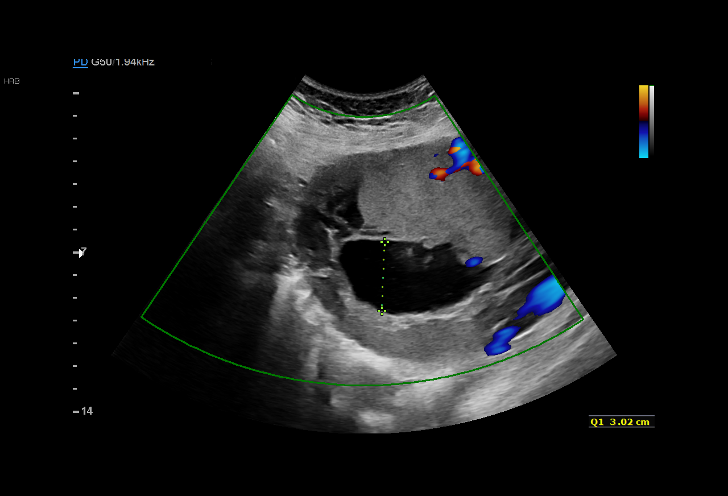
[im 24/26]
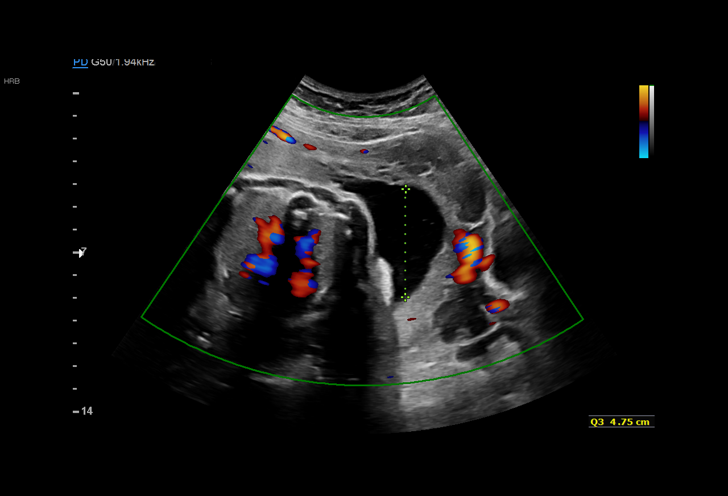
[im 26/26]
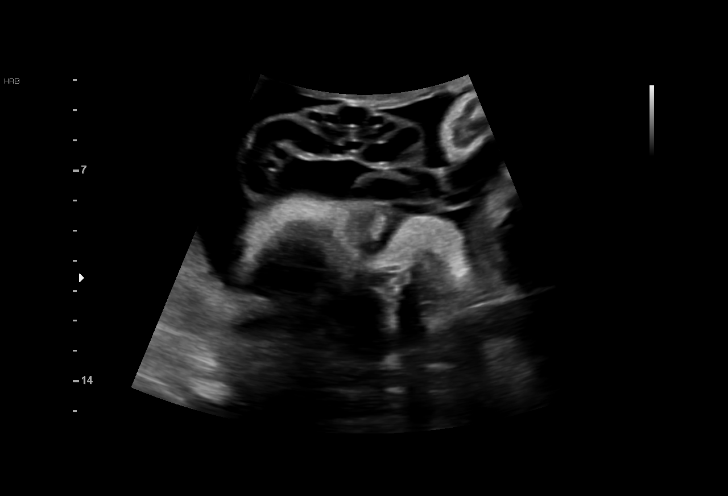

[14 of 26 positions shown; findings below may reference images not displayed]

OLIPHANT NP

Indications

 Encounter for other antenatal screening
 follow-up
 Advanced maternal age multigravida 35+,
 second trimester
 Anemia during pregnancy in second
 trimester
 28 weeks gestation of pregnancy
Fetal Evaluation

 Num Of Fetuses:          1
 Fetal Heart              146
 Rate(bpm):
 Cardiac Activity:        Observed
 Presentation:            Transverse, head to maternal right
 Placenta:                Anterior
 P. Cord Insertion:       Previously Visualized

 Amniotic Fluid
 AFI FV:      Within normal limits

 AFI Sum(cm)     %Tile       Largest Pocket(cm)
 18.68           72

 RUQ(cm)       RLQ(cm)       LUQ(cm)        LLQ(cm)

Biometry

 BPD:      75.1  mm     G. Age:  30w 1d         89  %    CI:         74.77  %    70 - 86
                                                         FL/HC:       18.0  %    18.8 -
 HC:      275.6  mm     G. Age:  30w 1d         75  %    HC/AC:       1.11       1.05 -
 AC:      248.2  mm     G. Age:  29w 0d         66  %    FL/BPD:      66.2  %    71 - 87
 FL:       49.7  mm     G. Age:  26w 5d          6  %    FL/AC:       20.0  %    20 - 24

 Est. FW:    5474   g    2 lb 11 oz      44  %
                    m
OB History

 Gravidity:    6
 Living:       3
Gestational Age

 LMP:           27w 0d        Date:  07/22/19                 EDD:    04/27/20
 U/S Today:     29w 0d                                        EDD:    04/13/20
 Best:          28w 2d     Det. By:  U/S  (12/02/19)          EDD:    04/18/20
Anatomy

 Cranium:               Appears normal         Aortic Arch:            Previously seen
 Cavum:                 Appears normal         Ductal Arch:            Previously seen
 Ventricles:            Appears normal         Diaphragm:              Appears normal
 Choroid Plexus:        Previously seen        Stomach:                Appears normal,
                                                                       left sided
 Cerebellum:            Previously seen        Abdomen:                Appears normal
 Posterior Fossa:       Previously seen        Abdominal Wall:         Previously seen
 Nuchal Fold:           Previously seen        Cord Vessels:           Previously seen
 Face:                  Profile appears        Kidneys:                Appear normal
                        normal, orbits prev
                        see
 Lips:                  Previously seen        Bladder:                Appears normal
 Thoracic:              Appears normal         Spine:                  Previously seen
 Heart:                 Previously seen        Upper Extremities:      Previously seen
 RVOT:                  Not well visualized    Lower Extremities:      Previously seen
 LVOT:                  Previously seen

 Other:  Heels previously visualized, 5th digit previously visualized, Fetus
         appears to be a male.
Cervix Uterus Adnexa

 Cervix
 Not visualized (advanced GA >60wks)
Impression

 Follow up growth to clear fetal anatomy and due to AMA >
 40yo
 Normal interval growth with measurements consistent with
 dates
 Good fetal movement and amniotic fluid volume
 Suboptimal views of the fetal anatomy was again seen due
 to maternal position.
Recommendations

 Continue serial growth given maternal age of 42 yo and the
 increased risk for fetal growth restriction and stillbirth
 Initiate weekly testing at 36 weeks.

## 2022-04-10 IMAGING — US US MFM OB FOLLOW-UP
1 series · 14 of 28 positions shown · non-contrast
Comparison: none

[Series 1: us mfm ob follow-up · 60 acquisitions, 14 frames shown]
[im 3/60]
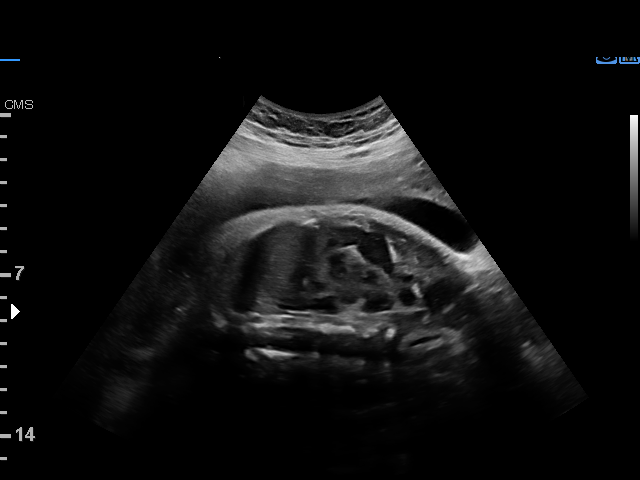
[im 7/60]
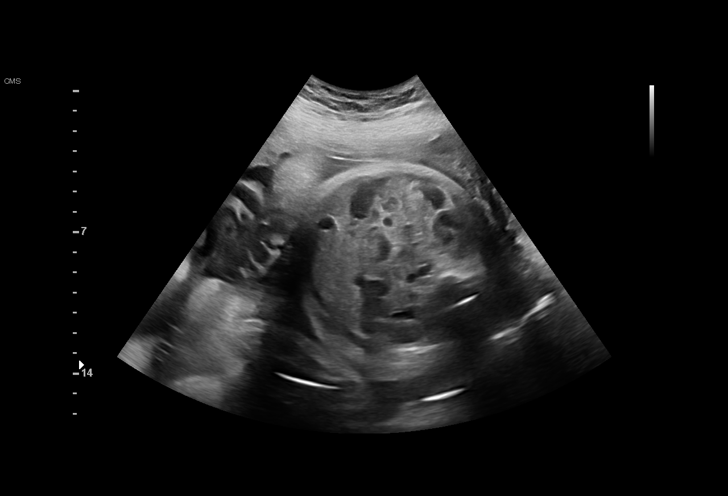
[im 11/60]
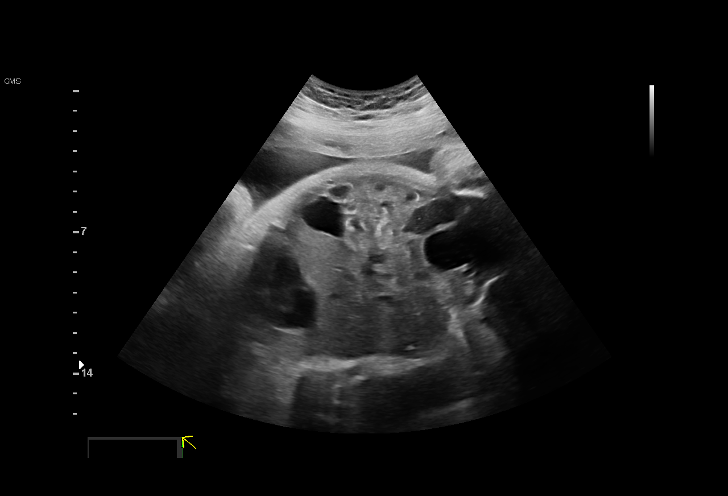
[im 16/60]
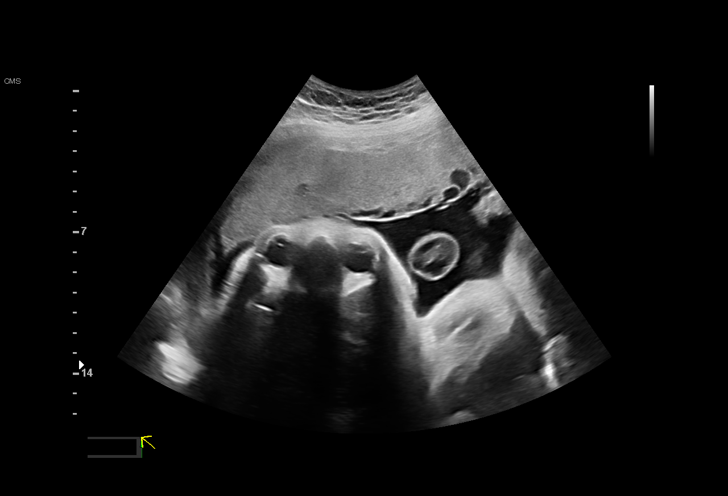
[im 20/60]
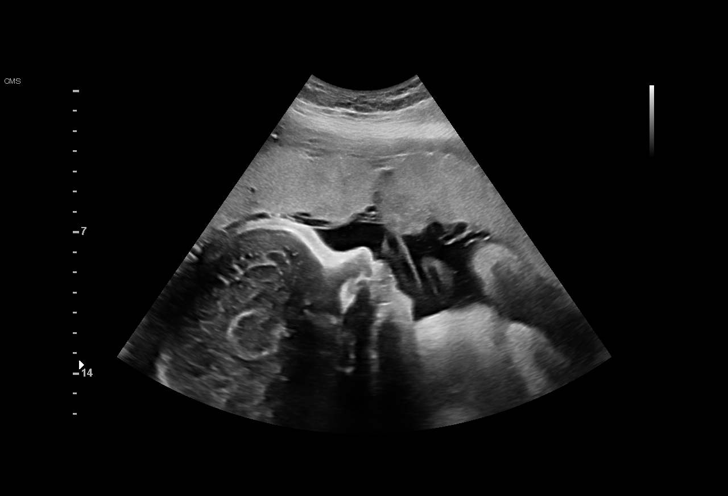
[im 25/60]
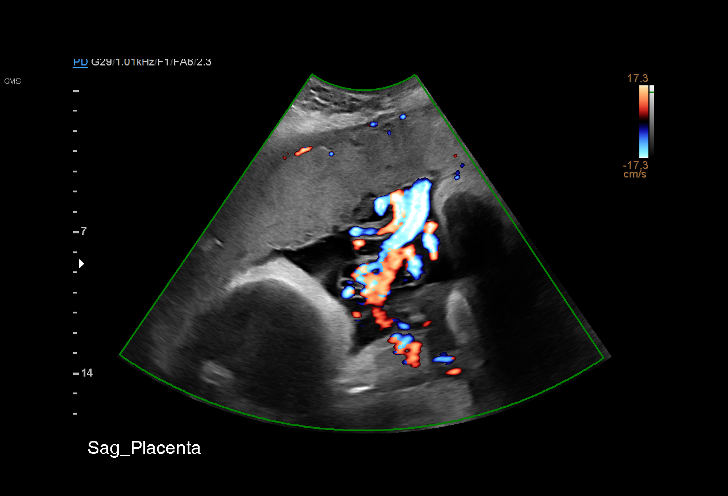
[im 29/60]
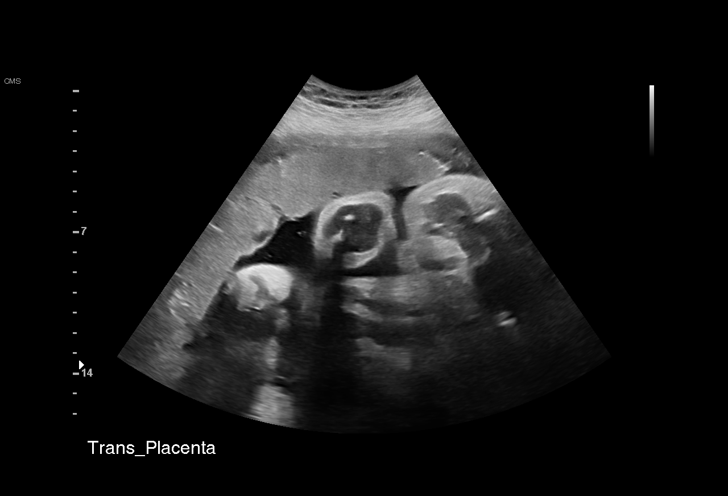
[im 33/60]
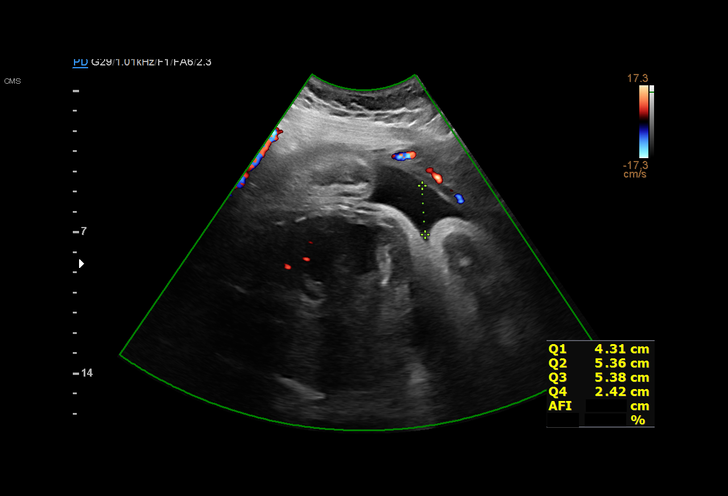
[im 38/60]
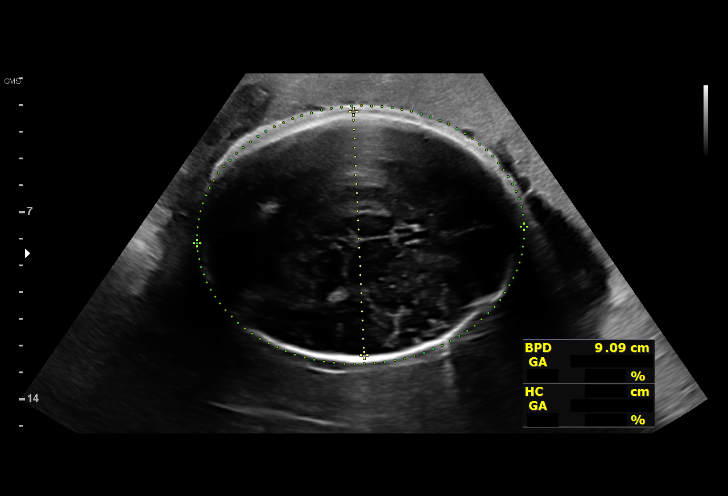
[im 42/60]
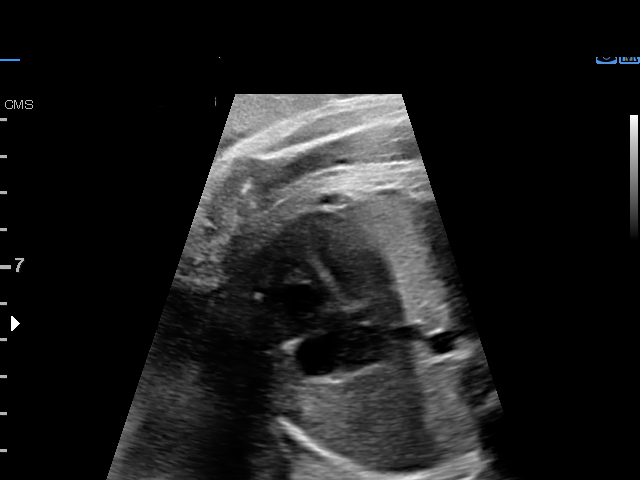
[im 46/60]
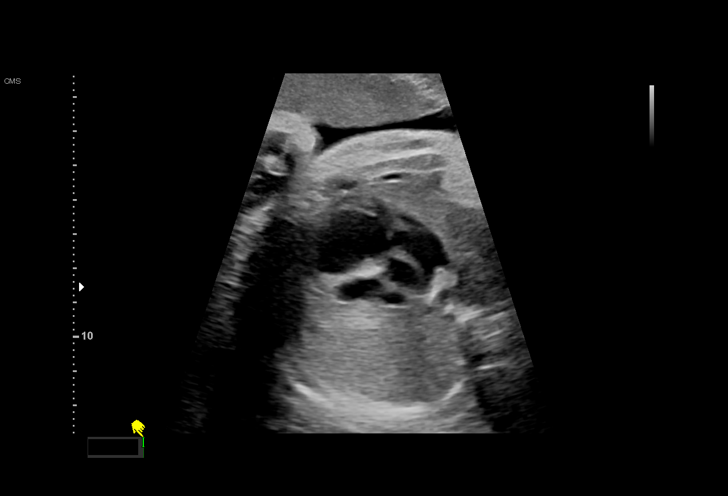
[im 51/60]
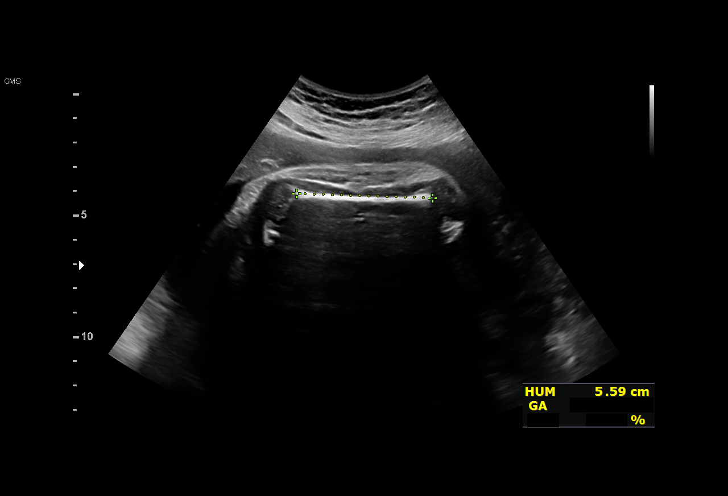
[im 55/60]
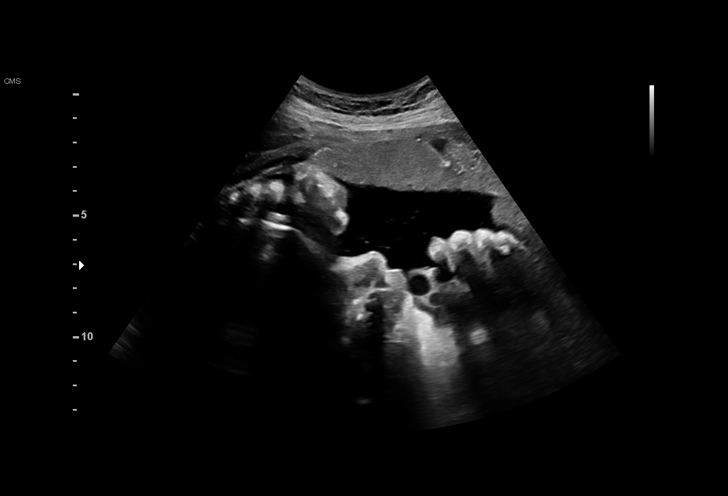
[im 60/60]
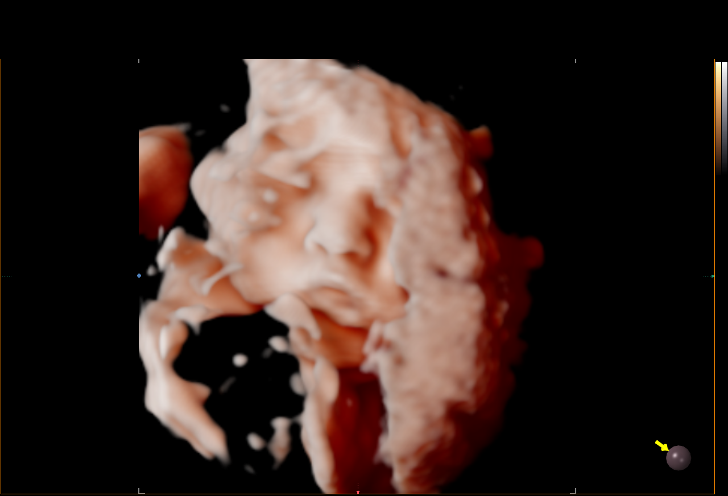

[14 of 28 positions shown; findings below may reference images not displayed]

KLEVER NP

                                                      BELMONDO

Indications

 Advanced maternal age multigravida 35+,
 third trimester
 Encounter for other antenatal screening
 follow-up
 Anemia during pregnancy in third trimester
 36 weeks gestation of pregnancy
Fetal Evaluation

 Num Of Fetuses:         1
 Fetal Heart Rate(bpm):  138
 Cardiac Activity:       Observed
 Presentation:           Breech
 Placenta:               Anterior
 P. Cord Insertion:      Visualized

 Amniotic Fluid
 AFI FV:      Within normal limits

 AFI Sum(cm)     %Tile       Largest Pocket(cm)
 17.5            65

 RUQ(cm)       RLQ(cm)       LUQ(cm)        LLQ(cm)

Biophysical Evaluation

 Amniotic F.V:   Within normal limits       F. Tone:        Observed
 F. Movement:    Observed                   Score:          [DATE]
 F. Breathing:   Observed
Biometry

 BPD:      90.8  mm     G. Age:  36w 6d         74  %    CI:        71.33   %    70 - 86
                                                         FL/HC:      18.4   %    20.1 -
 HC:      342.4  mm     G. Age:  39w 3d         90  %    HC/AC:      1.02        0.93 -
 AC:      334.4  mm     G. Age:  37w 2d         86  %    FL/BPD:     69.5   %    71 - 87
 FL:       63.1  mm     G. Age:  32w 4d        < 1  %    FL/AC:      18.9   %    20 - 24
 HUM:      56.1  mm     G. Age:  32w 5d        < 5  %
 LV:        2.4  mm

 Est. FW:    3555  gm      6 lb 7 oz     55  %
OB History

 Gravidity:    6
 Living:       3
Gestational Age

 LMP:           35w 0d        Date:  07/22/19                 EDD:   04/27/20
 U/S Today:     36w 4d                                        EDD:   04/16/20
 Best:          36w 2d     Det. By:  U/S  (12/02/19)          EDD:   04/18/20
Anatomy

 Cranium:               Appears normal         Aortic Arch:            Previously seen
 Cavum:                 Appears normal         Ductal Arch:            Previously seen
 Ventricles:            Appears normal         Diaphragm:              Appears normal
 Choroid Plexus:        Previously seen        Stomach:                Appears normal, left
                                                                       sided
 Cerebellum:            Previously seen        Abdomen:                Appears normal
 Posterior Fossa:       Previously seen        Abdominal Wall:         Previously seen
 Nuchal Fold:           Previously seen        Cord Vessels:           Previously seen
 Face:                  Appears normal         Kidneys:                Appear normal
                        (orbits and profile)
 Lips:                  Appears normal         Bladder:                Appears normal
 Thoracic:              Appears normal         Spine:                  Previously seen
 Heart:                 Appears normal         Upper Extremities:      Previously seen
                        (4CH, axis, and
                        situs)
 RVOT:                  Appears normal         Lower Extremities:      Previously seen
 LVOT:                  Appears normal

 Other:  Heels previously visualized, 5th digit previously visualized, Fetus
         appears to be a male.
Cervix Uterus Adnexa

 Cervix
 Not visualized (advanced GA >00wks)
 Uterus
 No abnormality visualized.

 Right Ovary
 Not visualized.

 Left Ovary
 Not visualized.

 Cul De Sac
 No free fluid seen.

 Adnexa
 No abnormality visualized.
Comments

 This patient was seen for a follow up growth scan and
 biophysical profile due to advanced maternal age (44 years
 old).  She denies any problems since her last exam.
 She was informed that the fetal growth and amniotic fluid
 level appears appropriate for her gestational age.
 A biophysical profile performed today was [DATE].
 Due to advanced maternal age, consideration should be
 given to delivery at around 39 weeks.
 Another biophysical profile was scheduled in 1 week.

## 2022-04-25 IMAGING — US US MFM FETAL BPP W/ NON-STRESS
1 series · 12 of 18 positions shown · non-contrast
Comparison: none

[Series 1: us mfm fetal bpp w/ non-stress · 18 acquisitions, 12 frames shown]
[im 1/18]
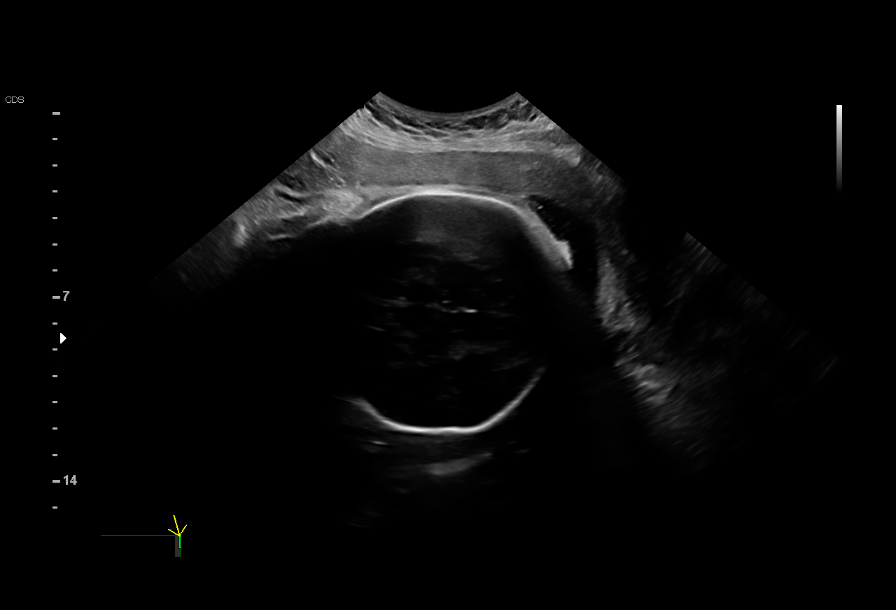
[im 3/18]
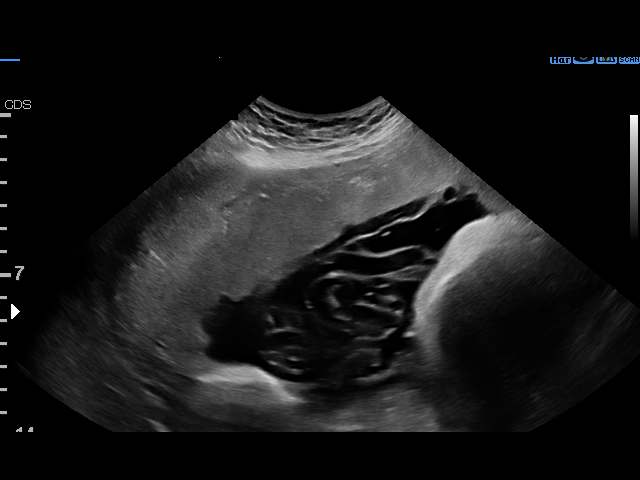
[im 4/18]
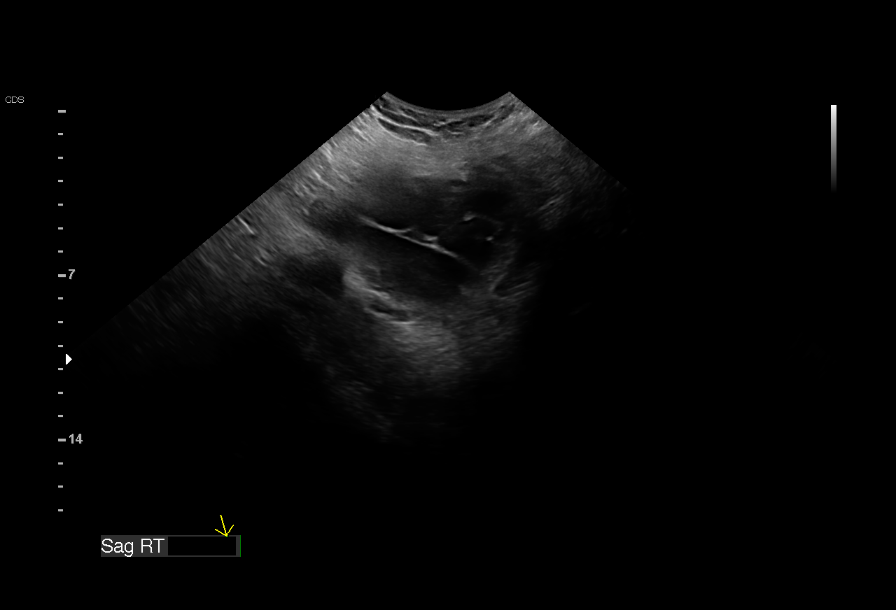
[im 6/18]
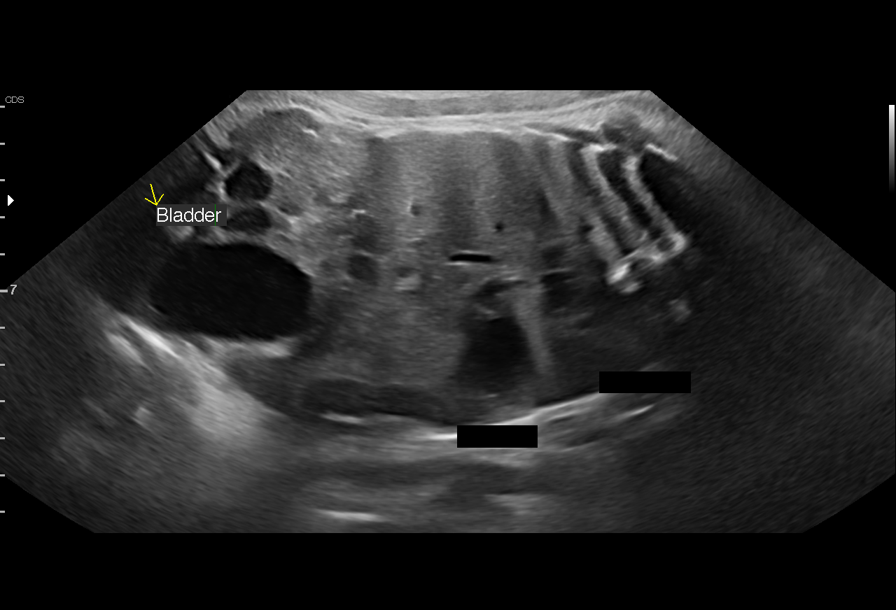
[im 7/18]
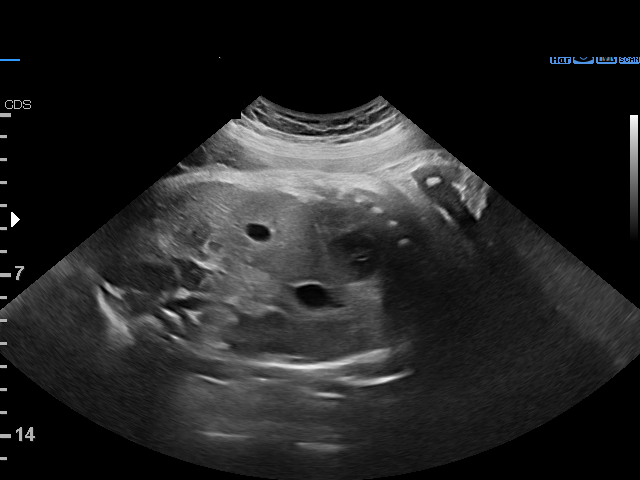
[im 9/18]
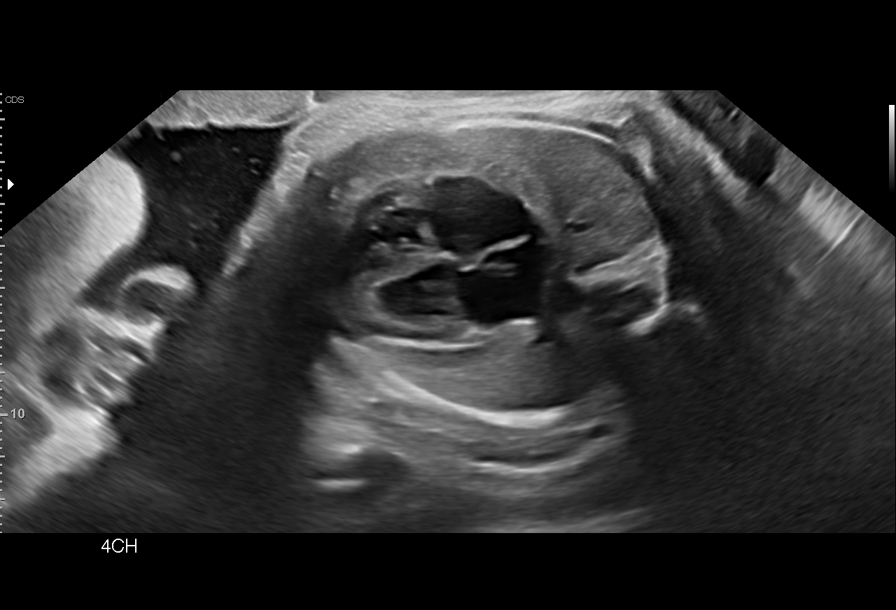
[im 10/18]
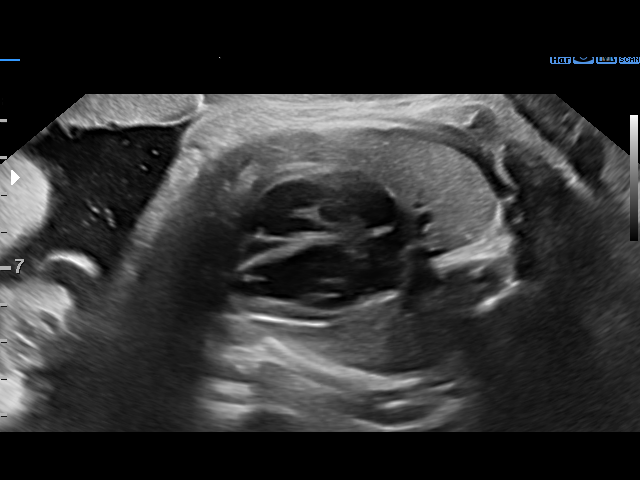
[im 12/18]
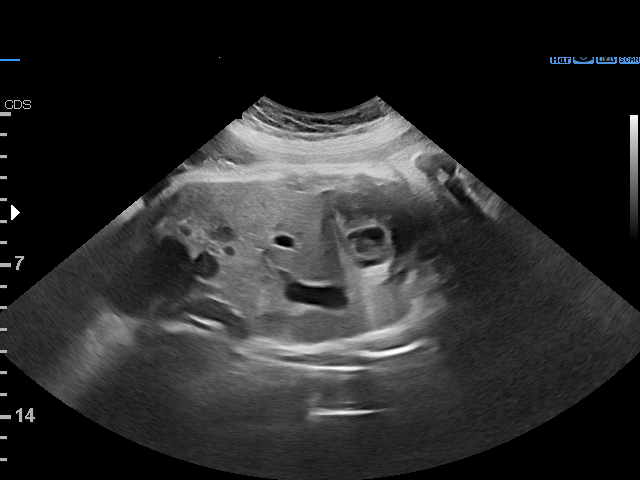
[im 13/18]
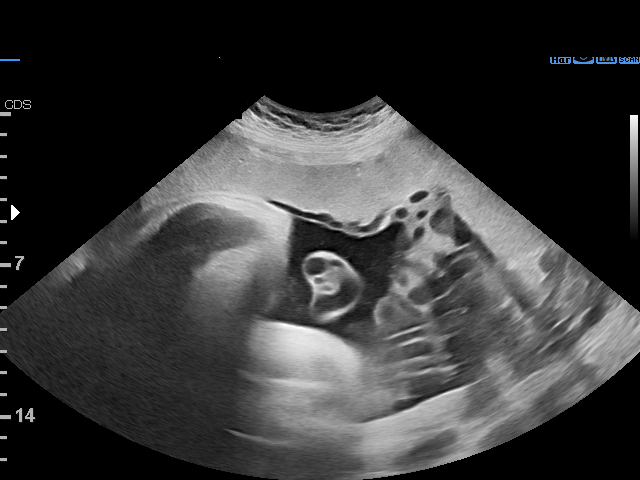
[im 15/18]
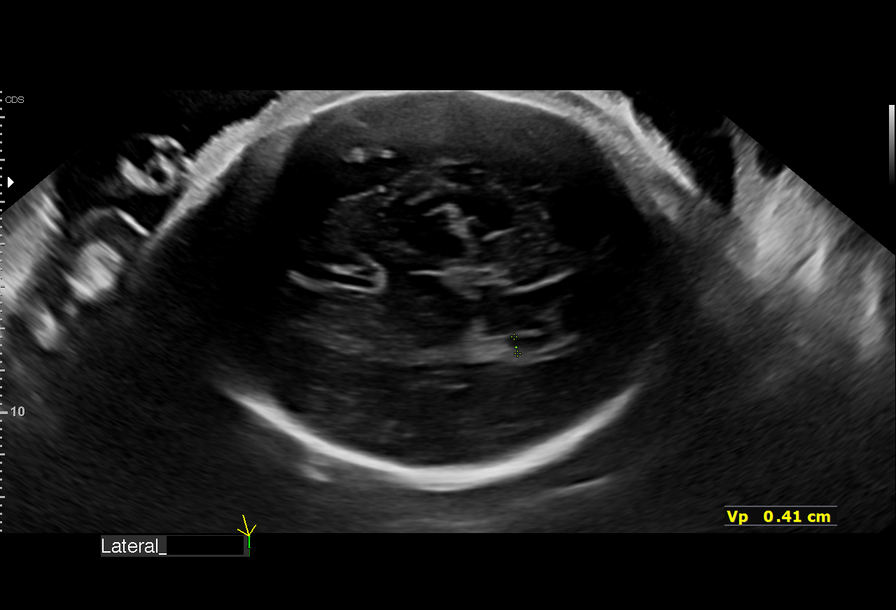
[im 16/18]
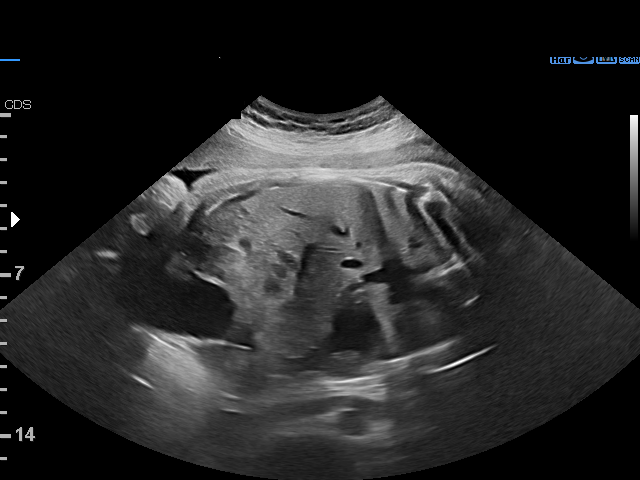
[im 18/18]
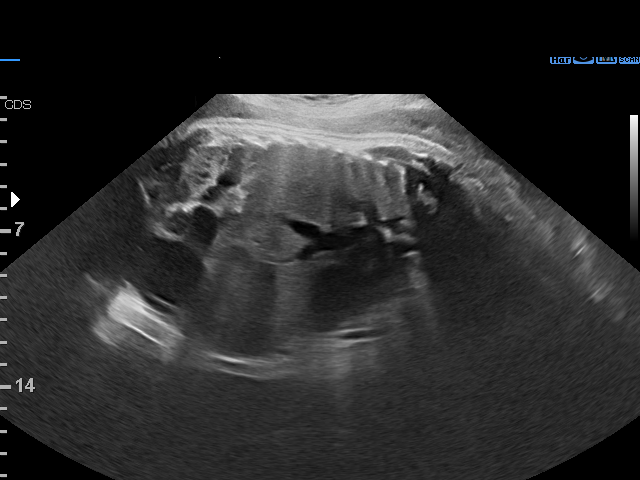

[12 of 18 positions shown; findings below may reference images not displayed]

GEKON NP

    W/NONSTRESS

Indications

 38 weeks gestation of pregnancy
 Advanced maternal age multigravida 35+,
 third trimester
 Encounter for other antenatal screening
 follow-up
 Anemia during pregnancy in third trimester
Fetal Evaluation

 Num Of Fetuses:         1
 Preg. Location:         Intrauterine
 Fetal Heart Rate(bpm):  145
 Cardiac Activity:       Observed
 Presentation:           Cephalic
 Placenta:               Anterior
 P. Cord Insertion:      Previously Visualized

 Amniotic Fluid
 AFI FV:      Within normal limits

 AFI Sum(cm)     %Tile       Largest Pocket(cm)
 14.6            57

 RUQ(cm)       RLQ(cm)       LUQ(cm)        LLQ(cm)

Biophysical Evaluation

 Amniotic F.V:   Pocket => 2 cm             F. Tone:        Observed
 F. Movement:    Observed                   N.S.T:          Reactive
 F. Breathing:   Not Observed               Score:          [DATE]
Biometry

 LV:        4.1  mm
OB History

 Gravidity:    6
 Living:       3
Gestational Age

 LMP:           37w 1d        Date:  07/22/19                 EDD:   04/27/20
 Best:          38w 3d     Det. By:  U/S  (12/02/19)          EDD:   04/18/20
Anatomy

 Cranium:               Previously seen        Aortic Arch:            Previously seen
 Cavum:                 Previously seen        Ductal Arch:            Previously seen
 Ventricles:            Appears normal         Diaphragm:              Appears normal
 Choroid Plexus:        Previously seen        Stomach:                Appears normal, left
                                                                       sided
 Cerebellum:            Previously seen        Abdomen:                Previously seen
 Posterior Fossa:       Previously seen        Abdominal Wall:         Previously seen
 Nuchal Fold:           Previously seen        Cord Vessels:           Previously seen
 Face:                  Orbits and profile     Kidneys:                Appear normal
                        previously seen
 Lips:                  Previously seen        Bladder:                Appears normal
 Thoracic:              Previously seen        Spine:                  Previously seen
 Heart:                 Appears normal         Upper Extremities:      Previously seen
                        (4CH, axis, and
                        situs)
 RVOT:                  Previously seen        Lower Extremities:      Previously seen
 LVOT:                  Previously seen

 Other:  Heels previously visualized, 5th digit previously visualized, Fetus
         appears to be a male.
Cervix Uterus Adnexa

 Cervix
 Not visualized (advanced GA >19wks)

 Uterus
 No abnormality visualized.

 Right Ovary
 No adnexal mass visualized.

 Left Ovary
 No adnexal mass visualized.
 Cul De Sac
 No free fluid seen.

 Adnexa
 No abnormality visualized.
Impression

 Advanced maternal age.  Patient return for antenatal
 testing.Amniotic fluid is normal and good fetal activity is seen
 .Fetal breathing movements did not meet the criteria (BPP) .
 NST is reactive.  BPP [DATE].
 We reassured the patient of the findings.

 She will be undergoing induction of labor on 04/11/2020.
                 Ramya, Hanafiah

## 2022-09-06 ENCOUNTER — Encounter (HOSPITAL_COMMUNITY): Payer: Self-pay

## 2022-09-06 ENCOUNTER — Other Ambulatory Visit: Payer: Self-pay

## 2022-09-06 ENCOUNTER — Emergency Department (HOSPITAL_COMMUNITY)
Admission: EM | Admit: 2022-09-06 | Discharge: 2022-09-07 | Disposition: A | Discharge status: Home / Self Care | Payer: Medicaid Other | Attending: Emergency Medicine | Admitting: Emergency Medicine

## 2022-09-06 DIAGNOSIS — R1032 Left lower quadrant pain: Secondary | ICD-10-CM | POA: Diagnosis present

## 2022-09-06 DIAGNOSIS — R102 Pelvic and perineal pain: Secondary | ICD-10-CM | POA: Diagnosis not present

## 2022-09-06 LAB — URINALYSIS, ROUTINE W REFLEX MICROSCOPIC
Bacteria, UA: NONE SEEN
Bilirubin Urine: NEGATIVE
Glucose, UA: NEGATIVE mg/dL
Ketones, ur: NEGATIVE mg/dL
Leukocytes,Ua: NEGATIVE
Nitrite: NEGATIVE
Protein, ur: NEGATIVE mg/dL
Specific Gravity, Urine: 1.018 (ref 1.005–1.030)
pH: 6 (ref 5.0–8.0)

## 2022-09-06 LAB — CBC
HCT: 34.6 % — ABNORMAL LOW (ref 36.0–46.0)
Hemoglobin: 11.2 g/dL — ABNORMAL LOW (ref 12.0–15.0)
MCH: 29.6 pg (ref 26.0–34.0)
MCHC: 32.4 g/dL (ref 30.0–36.0)
MCV: 91.5 fL (ref 80.0–100.0)
Platelets: 261 10*3/uL (ref 150–400)
RBC: 3.78 MIL/uL — ABNORMAL LOW (ref 3.87–5.11)
RDW: 12.6 % (ref 11.5–15.5)
WBC: 6.1 10*3/uL (ref 4.0–10.5)
nRBC: 0 % (ref 0.0–0.2)

## 2022-09-06 LAB — I-STAT BETA HCG BLOOD, ED (MC, WL, AP ONLY): I-stat hCG, quantitative: <5 m[IU]/mL (ref <5)

## 2022-09-06 NOTE — ED Triage Notes (Signed)
POV/ ABD pain x 2 days/ LLQ/ c/o of swelling and pain to the area/ pt describes pain as severe menstrual cramps/ nausea

## 2022-09-07 ENCOUNTER — Emergency Department (HOSPITAL_COMMUNITY): Payer: Medicaid Other

## 2022-09-07 LAB — COMPREHENSIVE METABOLIC PANEL
ALT: 23 U/L (ref 0–44)
AST: 20 U/L (ref 15–41)
Albumin: 3.6 g/dL (ref 3.5–5.0)
Alkaline Phosphatase: 38 U/L (ref 38–126)
Anion gap: 8 (ref 5–15)
BUN: 14 mg/dL (ref 6–20)
CO2: 22 mmol/L (ref 22–32)
Calcium: 8.8 mg/dL — ABNORMAL LOW (ref 8.9–10.3)
Chloride: 107 mmol/L (ref 98–111)
Creatinine, Ser: 0.61 mg/dL (ref 0.44–1.00)
GFR, Estimated: >60 mL/min (ref >60)
Glucose, Bld: 122 mg/dL — ABNORMAL HIGH (ref 70–99)
Potassium: 4 mmol/L (ref 3.5–5.1)
Sodium: 137 mmol/L (ref 135–145)
Total Bilirubin: 0.2 mg/dL — ABNORMAL LOW (ref 0.3–1.2)
Total Protein: 5.7 g/dL — ABNORMAL LOW (ref 6.5–8.1)

## 2022-09-07 LAB — LIPASE, BLOOD: Lipase: 32 U/L (ref 11–51)

## 2022-09-07 MED ORDER — FENTANYL CITRATE PF 50 MCG/ML IJ SOSY
50.0000 ug | PREFILLED_SYRINGE | Freq: Once | INTRAMUSCULAR | Status: AC
  Administered 2022-09-07: 50 ug via INTRAVENOUS
  Filled 2022-09-07: qty 1

## 2022-09-07 MED ORDER — IOHEXOL 350 MG/ML SOLN
75.0000 mL | Freq: Once | INTRAVENOUS | Status: AC | PRN
  Administered 2022-09-07: 75 mL via INTRAVENOUS

## 2022-09-07 NOTE — ED Provider Notes (Signed)
Patient signed out to me at 0700 by Dr. Clayborne Dana pending ultrasound read.  In short this is a 47 year old female with no significant past medical history that presented to the emergency department with left lower quadrant abdominal pain.  She had workup performed that showed normal labs and a CT abdomen pelvis that showed no evidence of diverticulitis but did show some vascular congestion in the pelvis.  Pelvic ultrasound was performed that showed no acute abnormalities. Patient's abdomen is soft and minimally tender in the LLQ without rebound or guarding.  Patient is stable for discharge home with outpatient GYN follow-up and was given strict return precautions.   Rexford Maus, DO 09/07/22 [id number]

## 2022-09-07 NOTE — Discharge Instructions (Signed)
You were seen in the emergency department for your abdominal and pelvic pain.  Your workup did not show any obvious explanation for your pain and it is unclear when it is causing it at this time.  You can continue to take Tylenol and Motrin as needed for pain and both can be taken up to every 6 hours.  You should follow-up with your primary doctor and GYN to have your symptoms rechecked.  You should return to the emergency department for significantly worsening pain, fevers, repetitive vomiting or if you have any other new or concerning symptoms.

## 2022-09-07 NOTE — ED Provider Notes (Signed)
[place] EMERGENCY DEPARTMENT AT [facility] Provider Note   CSN: [id number] Arrival date & time: 09/06/22  2232     History  Chief Complaint  Patient presents with   Abdominal Pain    TASHANA HABERL is a 47 y.o. female.  47 year old female presents ER today secondary to left lower quadrant/pelvic pain.  Patient states that she missed her period last month and now she is started.  But she has pretty severe pain in her left lower quadrant worse with movement.  She also seems like she has gas and may be some softer stools than normal.  No urinary symptoms.  No nausea or vomiting.  No fevers.  No change in food intake.   Abdominal Pain      Home Medications Prior to Admission medications   Medication Sig Start Date End Date Taking? Authorizing Provider  acetaminophen (TYLENOL) 325 MG tablet Take 2 tablets (650 mg total) by mouth every 4 (four) hours as needed (for pain scale < 4). 04/14/20   Leftwich-Kirby, Wilmer Floor, CNM  doxylamine, Sleep, (UNISOM) 25 MG tablet Take 25 mg by mouth at bedtime as needed for sleep.    [provider]  famotidine (ZANTAC 360) 10 MG tablet Take 10 mg by mouth daily as needed for heartburn or indigestion.    [provider]  FEROSUL 325 (65 Fe) MG tablet Take 1 tablet (325 mg total) by mouth every other day. 04/14/20   Leftwich-Kirby, Wilmer Floor, CNM  Homeopathic Products (ZICAM ALLERGY RELIEF NA) Place 1 spray into the nose daily as needed (allergies).    [provider]  ibuprofen (ADVIL) 600 MG tablet Take 1 tablet (600 mg total) by mouth every 6 (six) hours. 04/14/20   Leftwich-Kirby, Wilmer Floor, CNM  metroNIDAZOLE (FLAGYL) 500 MG tablet Take 1 tablet (500 mg total) by mouth 2 (two) times daily. 12/24/21   Marcine Matar, MD  Prenatal Vit-Fe Fumarate-FA (WESTAB PLUS) 27-1 MG TABS Take 1 tablet by mouth daily. 03/31/20   [provider]      Allergies    Patient has no known allergies.    Review of Systems    Review of Systems  Gastrointestinal:  Positive for abdominal pain.    Physical Exam Updated Vital Signs BP 115/76 (BP Location: Left Arm)   Pulse 76   Temp 98.2 F (36.8 C) (Oral)   Resp 17   Wt 58.1 kg   LMP 09/01/2022 (Exact Date)   SpO2 98%   BMI 25.85 kg/m  Physical Exam Vitals and nursing note reviewed.  Constitutional:      Appearance: She is well-developed.  HENT:     Head: Normocephalic and atraumatic.  Cardiovascular:     Rate and Rhythm: Normal rate and regular rhythm.  Pulmonary:     Effort: No respiratory distress.     Breath sounds: No stridor.  Abdominal:     General: There is no distension.     Tenderness: There is abdominal tenderness in the left lower quadrant. There is no guarding or rebound.  Musculoskeletal:     Cervical back: Normal range of motion.  Neurological:     Mental Status: She is alert.     ED Results / Procedures / Treatments   Labs (all labs ordered are listed, but only abnormal results are displayed) Labs Reviewed  COMPREHENSIVE METABOLIC PANEL - Abnormal; Notable for the following components:      Result Value   Glucose, Bld 122 (*)  Calcium 8.8 (*)    Total Protein 5.7 (*)    Total Bilirubin 0.2 (*)    All other components within normal limits  CBC - Abnormal; Notable for the following components:   RBC 3.78 (*)    Hemoglobin 11.2 (*)    HCT 34.6 (*)    All other components within normal limits  URINALYSIS, ROUTINE W REFLEX MICROSCOPIC - Abnormal; Notable for the following components:   Hgb urine dipstick LARGE (*)    All other components within normal limits  LIPASE, BLOOD  I-STAT BETA HCG BLOOD, ED ([facility], [facility], [facility] ONLY)    EKG None  Radiology No results found.  Procedures Procedures    Medications Ordered in ED Medications - No data to display  ED Course/ Medical Decision Making/ A&P                             Medical Decision Making Amount and/or Complexity of Data Reviewed Radiology:  ordered.  Risk Prescription drug management.   Will ct for diverticulitis and treat symptoms.   CT without e/o diverticulitis or acute changes in LLQ to explain symptoms. Did have some pelvic congestion so [place] ordered to r/o reproductive abnormalities   Care transferred pending reeval and [place] results.    Final Clinical Impression(s) / ED Diagnoses Final diagnoses:  None    Rx / DC Orders ED Discharge Orders     None         Haylie Mccutcheon, Barbara Cower, MD 09/07/22 2315

## 2022-09-07 NOTE — ED Notes (Signed)
Patient transported to Ultrasound 

## 2022-09-10 ENCOUNTER — Ambulatory Visit: Payer: Self-pay | Admitting: *Deleted

## 2022-09-10 ENCOUNTER — Other Ambulatory Visit: Payer: Self-pay | Admitting: Internal Medicine

## 2022-09-10 ENCOUNTER — Ambulatory Visit: Payer: Medicaid Other | Attending: Internal Medicine | Admitting: Internal Medicine

## 2022-09-10 ENCOUNTER — Encounter: Payer: Self-pay | Admitting: Internal Medicine

## 2022-09-10 VITALS — BP 110/70 | HR 72 | Temp 98.5°F | Ht 59.0 in | Wt 124.0 lb

## 2022-09-10 DIAGNOSIS — R002 Palpitations: Secondary | ICD-10-CM | POA: Diagnosis not present

## 2022-09-10 DIAGNOSIS — K529 Noninfective gastroenteritis and colitis, unspecified: Secondary | ICD-10-CM | POA: Diagnosis not present

## 2022-09-10 DIAGNOSIS — D649 Anemia, unspecified: Secondary | ICD-10-CM | POA: Diagnosis not present

## 2022-09-10 NOTE — Telephone Encounter (Signed)
  Chief Complaint: upper abdominal pain seen in ED 09/09/22 told to f/u with PCP Symptoms: chest pain shortness of breath, nausea, did not vomit. Left arm pain shoulder pain "feels like heart burn". Diarrhea 2 times in 24 hours. Watery stool no blood.  Frequency: "for a while" Pertinent Negatives: Patient denies fever,  Disposition: ED /[] Urgent Care (no appt availability in office) / Appointment(In office/virtual)/  [place] [facility] [facility]/ Home Care/ Refused Recommended Disposition /[] [facility]/  Follow-up with PCP Additional Notes:   Recommended ED due to sx continuing. Discharged from hospital 09/09/22 and told to f/u with PCP. No available appt with any provider until 09/26/22. Please advise.       Reason for Disposition  [1] MILD-MODERATE pain AND [2] constant AND [3] present > 2 hours  Answer Assessment - Initial Assessment Questions 1. LOCATION: "Where does it hurt?"      Upper abdomen and chest  2. RADIATION: "Does the pain shoot anywhere else?" (e.g., chest, back)     Left arm pain shoulders  3. ONSET: "When did the pain begin?" (e.g., minutes, hours or days ago)      "For a while" 4. SUDDEN: "Gradual or sudden onset?"     na 5. PATTERN "Does the pain come and go, or is it constant?"    - If it comes and goes: "How long does it last?" "Do you have pain now?"     (Note: Comes and goes means the pain is intermittent. It goes away completely between bouts.)    - If constant: "Is it getting better, staying the same, or getting worse?"      (Note: Constant means the pain never goes away completely; most serious pain is constant and gets worse.)      Constant  6. SEVERITY: "How bad is the pain?"  (e.g., Scale 1-10; mild, moderate, or severe)    - MILD (1-3): Doesn't interfere with normal activities, abdomen soft and not tender to touch..     - MODERATE (4-7): Interferes with normal activities or awakens from sleep, abdomen tender to touch.     - SEVERE  (8-10): Excruciating pain, doubled over, unable to do any normal activities.       Tired feels like heart burn 7. RECURRENT SYMPTOM: "Have you ever had this type of stomach pain before?" If Yes, ask: "When was the last time?" and "What happened that time?"      Yes seen in ED and sent home  8. AGGRAVATING FACTORS: "Does anything seem to cause this pain?" (e.g., foods, stress, alcohol)     na 9. CARDIAC SYMPTOMS: "Do you have any of the following symptoms: chest pain, difficulty breathing, sweating, nausea?"     Chest pain shortness of breath nausea  10. OTHER SYMPTOMS: "Do you have any other symptoms?" (e.g., back pain, diarrhea, fever, urination pain, vomiting)       Diarrhea , watery stools. break out in sweat yesterday , nausea chest pain left arm and shoulder pain  11. PREGNANCY: "Is there any chance you are pregnant?" "When was your last menstrual period?"       na  Protocols used: Abdominal Pain - Upper-A-AH

## 2022-09-10 NOTE — Progress Notes (Signed)
Patient ID: ARPI DIEBOLD, female    DOB: [birth year]  MRN: [id number]  CC: Follow-up (ER visit.  /Discomfort in chest, L rib cage & lower L abdomen radiating to hips & lower back. /Episode of SOB, lethargic, dizziness, low BP - yesterday)   Subjective: [name] is a 47 y.o. female who presents for ER f/u.  Daughter, Marena Chancy, is with her.  Her concerns today include:  Fhx of colon CA in mother  Patient presents as follow-up from ER visit.  She was seen there 09/07/2022 with left lower quadrant abdominal pain.  CBC revealed slight normocytic anemia.  Kidney and liver function test normal.  Pregnancy test negative.  CT of abdomen and pelvis showed some venous congestion in the pelvis, moderate amount of stool in the ascending colon, gastroenteritis without mesenteric edema and mild prominence of the right ovary due to 2 dominant follicles.  Pelvic ultrasound was normal.  Today: Since ER visit, she has felt tired. Yesterday while sitting in her room, she suddenly felt hot and felt her heart racing.  Felt like she was going to vomit.  She felt disoriented.  She had 2 episodes of diarrhea earlier in the day.  She sat on the cold floor in her room.  She called her daughter who was in another room.  Daughter called EMS.  Reports that blood pressure was found to be low but they do not recall the reading.  Patient declined being taken to the hospital. Today she states that she feels tired and lacks appetite.  Stomach feels upset.  She has not had any vomiting.  No more diarrhea.  Patient Active Problem List   Diagnosis Date Noted   Dysmenorrhea 11/12/2021   Menorrhagia with regular cycle 11/12/2021   SVD (spontaneous vaginal delivery) 04/13/2020   Supervision of low-risk pregnancy, third trimester 04/11/2020   Indication for care in labor and delivery, antepartum 04/11/2020     Current Outpatient Medications on File Prior to Visit  Medication Sig Dispense Refill   acetaminophen (TYLENOL) 325 MG  tablet Take 2 tablets (650 mg total) by mouth every 4 (four) hours as needed (for pain scale < 4). 30 tablet 0   ibuprofen (ADVIL) 600 MG tablet Take 1 tablet (600 mg total) by mouth every 6 (six) hours. 30 tablet 0   PARoxetine (PAXIL) 10 MG tablet Take 10 mg by mouth daily. Take 1 tbalet (10 MG) by mouth every morning.     doxylamine, Sleep, (UNISOM) 25 MG tablet Take 25 mg by mouth at bedtime as needed for sleep. (Patient not taking: Reported on 09/10/2022)     famotidine (ZANTAC 360) 10 MG tablet Take 10 mg by mouth daily as needed for heartburn or indigestion. (Patient not taking: Reported on 09/10/2022)     FEROSUL 325 (65 Fe) MG tablet Take 1 tablet (325 mg total) by mouth every other day. (Patient not taking: Reported on 09/10/2022) 30 tablet 0   Homeopathic Products (ZICAM ALLERGY RELIEF NA) Place 1 spray into the nose daily as needed (allergies). (Patient not taking: Reported on 09/10/2022)     metroNIDAZOLE (FLAGYL) 500 MG tablet Take 1 tablet (500 mg total) by mouth 2 (two) times daily. (Patient not taking: Reported on 09/10/2022) 14 tablet 0   Prenatal Vit-Fe Fumarate-FA (WESTAB PLUS) 27-1 MG TABS Take 1 tablet by mouth daily. (Patient not taking: Reported on 09/10/2022)     No current facility-administered medications on file prior to visit.    No Known Allergies  Social History   Socioeconomic History   Marital status: Legally Separated    Spouse name: Not on file   Number of children: 3   Years of education: Not on file   Highest education level: 12th grade  Occupational History   Occupation: Self employed - Land  Tobacco Use   Smoking status: Never   Smokeless tobacco: Never   Tobacco comments:    marijuana last week  Vaping Use   Vaping Use: Never used  Substance and Sexual Activity   Alcohol use: Not Currently   Drug use: Yes    Types: Marijuana    Comment: daily   Sexual activity: Not on file  Other Topics Concern   Not on file  Social History Narrative    Not on file   Social Determinants of Health   Financial Resource Strain: Not on file  Food Insecurity: Not on file  Transportation Needs: Not on file  Physical Activity: Not on file  Stress: Not on file  Social Connections: Not on file  Intimate Partner Violence: Not on file    Family History  Problem Relation Age of Onset   Hyperthyroidism Mother    Colon cancer Mother    Diabetes Maternal Grandmother    Diabetes Paternal Grandfather    Breast cancer Neg Hx     Past Surgical History:  Procedure Laterality Date   CHOLECYSTECTOMY N/A 10/27/2018   Procedure: LAPAROSCOPIC CHOLECYSTECTOMY;  Surgeon: Harriette Bouillon, MD;  Location: [facility] OR;  Service: General;  Laterality: N/A;   WISDOM TOOTH EXTRACTION      ROS: Review of Systems Negative except as stated above  PHYSICAL EXAM: BP 110/70 (BP Location: Left Arm, Patient Position: Sitting, Cuff Size: Normal)   Pulse 72   Temp 98.5 F (36.9 C) (Oral)   Ht  (1.499 m)   Wt 124 lb (56.2 kg)   LMP 09/01/2022 (Exact Date)   SpO2 99%   BMI 25.04 kg/m   Wt Readings from Last 3 Encounters:  09/10/22 124 lb (56.2 kg)  09/06/22 128 lb (58.1 kg)  12/20/21 132 lb 9.6 oz (60.1 kg)  Sitting: 120/80, P62 Standing: 120/82, 70  Physical Exam  General appearance - alert, well appearing, young to middle-age Caucasian female and in no distress Mental status - normal mood, behavior, speech, dress, motor activity, and thought processes Mouth - mucous membranes moist, pharynx normal without lesions Chest - clear to auscultation, no wheezes, rales or rhonchi, symmetric air entry Heart - normal rate, regular rhythm, normal S1, S2, no murmurs, rubs, clicks or gallops      Latest Ref Rng & Units 09/06/2022   11:06 PM 11/12/2021    2:55 PM 10/26/2018    9:16 AM  CMP  Glucose 70 - 99 mg/dL 161  90  80   BUN 6 - 20 mg/dL Creatinine 0.44 - 1.00 mg/dL 0.96  0.45  4.09   Sodium 135 - 145 mmol/L 137  140  141   Potassium 3.5  - 5.1 mmol/L 4.0  4.4  4.8   Chloride 98 - 111 mmol/L 107  103  111   CO2 22 - 32 mmol/L Calcium 8.9 - 10.3 mg/dL 8.8  9.4  9.1   Total Protein 6.5 - 8.1 g/dL 5.7  6.8  6.3   Total Bilirubin 0.3 - 1.2 mg/dL 0.2  0.3  1.0   Alkaline Phos 38 - 126 U/L  38  62  36   AST 15 - 41 U/L 20  42  30   ALT 0 - 44 U/L Lipid Panel     Component Value Date/Time   CHOL 182 11/12/2021 1455   TRIG 184 (H) 11/12/2021 1455   HDL 52 11/12/2021 1455   CHOLHDL 3.5 11/12/2021 1455   LDLCALC 98 11/12/2021 1455    CBC    Component Value Date/Time   WBC 6.1 09/06/2022 2306   RBC 3.78 (L) 09/06/2022 2306   HGB 11.2 (L) 09/06/2022 2306   HGB 12.6 11/12/2021 1455   HCT 34.6 (L) 09/06/2022 2306   HCT 37.6 11/12/2021 1455   PLT 261 09/06/2022 2306   PLT 306 11/12/2021 1455   MCV 91.5 09/06/2022 2306   MCV 90 11/12/2021 1455   MCH 29.6 09/06/2022 2306   MCHC 32.4 09/06/2022 2306   RDW 12.6 09/06/2022 2306   RDW 13.0 11/12/2021 1455   LYMPHSABS 1.8 10/26/2018 0916   MONOABS 0.6 10/26/2018 0916   EOSABS 0.1 10/26/2018 0916   BASOSABS 0.0 10/26/2018 0916    ASSESSMENT AND PLAN: 1. Gastroenteritis Recommend that she stay hydrated.  Recommend liquid to soft diet over the next 48 hours.  2. Normocytic anemia She had a mild anemia on blood test from ER.  We will check iron studies. - Iron, TIBC and Ferritin Panel  3. Palpitations Patient reports feeling hot and heart racing yesterday during her acute episode.  I think this was most likely related to the gastroenteritis but we will check thyroid levels. - [id number]    Patient was given the opportunity to ask questions.  Patient verbalized understanding of the plan and was able to repeat key elements of the plan.   This documentation was completed using Paediatric nurse.  Any transcriptional errors are unintentional.  Orders Placed This Encounter  Procedures   TSH+T4F+T3Free   Iron, TIBC and  Ferritin Panel     Requested Prescriptions    No prescriptions requested or ordered in this encounter    Return if symptoms worsen or fail to improve.  Jonah Blue, MD, FACP

## 2022-09-10 NOTE — Patient Instructions (Signed)
Stay hydrated. Liquid to soft diet for the next 48 hours.

## 2022-09-10 NOTE — Telephone Encounter (Signed)
Patient being seen today by PCP

## 2022-09-11 LAB — TSH+T4F+T3FREE
Free T4: 1.23 ng/dL (ref 0.82–1.77)
Free T4: 1.3 ng/dL (ref 0.82–1.77)
T3, Free: 2.7 pg/mL (ref 2.0–4.4)
T3, Free: 3.3 pg/mL (ref 2.0–4.4)
TSH: 0.575 u[IU]/mL (ref 0.450–4.500)
TSH: 1.6 u[IU]/mL (ref 0.450–4.500)

## 2022-09-11 LAB — IRON,TIBC AND FERRITIN PANEL
Ferritin: 24 ng/mL (ref 15–150)
Iron Saturation: 21 % (ref 15–55)
Iron: 76 ug/dL (ref 27–159)
Total Iron Binding Capacity: 360 ug/dL (ref 250–450)
UIBC: 284 ug/dL (ref 131–425)

## 2022-09-11 NOTE — Progress Notes (Signed)
Thyroid level normal. 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  Recommend purchasing iron over-the-counter and taking 1 tablet 4 times a week for at least 1 month.  The name of iron that is sold over-the-counter is Ferrous Sulfate 64 mg.

## 2022-10-15 ENCOUNTER — Encounter: Payer: Self-pay | Admitting: Nurse Practitioner

## 2022-10-15 ENCOUNTER — Ambulatory Visit (INDEPENDENT_AMBULATORY_CARE_PROVIDER_SITE_OTHER): Payer: Medicaid Other | Admitting: Nurse Practitioner

## 2022-10-15 VITALS — BP 120/84 | HR 81 | Ht 60.0 in | Wt 125.0 lb

## 2022-10-15 DIAGNOSIS — N951 Menopausal and female climacteric states: Secondary | ICD-10-CM | POA: Diagnosis not present

## 2022-10-15 DIAGNOSIS — N9489 Other specified conditions associated with female genital organs and menstrual cycle: Secondary | ICD-10-CM | POA: Diagnosis not present

## 2022-10-15 DIAGNOSIS — R102 Pelvic and perineal pain: Secondary | ICD-10-CM | POA: Diagnosis not present

## 2022-10-15 DIAGNOSIS — N926 Irregular menstruation, unspecified: Secondary | ICD-10-CM

## 2022-10-15 LAB — PREGNANCY, URINE: Preg Test, Ur: NEGATIVE

## 2022-10-15 MED ORDER — NORETHIN ACE-ETH ESTRAD-FE 1-20 MG-MCG PO TABS: 1.0000 | ORAL_TABLET | Freq: Every day | ORAL | 0 refills | Status: DC

## 2022-10-15 NOTE — Progress Notes (Signed)
   Acute Office Visit  Subjective:    Patient ID: [name], female    DOB: 20-Dec-1975, 47 y.o.   MRN: [id number]   HPI 47 y.o. [id number] presents today for pelvic pain for about a year. Pain only occurs when lying down or pushing on pelvic area. CT scan in ER 09/07/2022 showed constipation and diverticulosis without evidence of bowel obstruction or inflammation, gastroenteritis without evidence of mesenteric edema, moderate left pelvic venous congestion with engorged left gonadal vein, mildly prominent uterus with engorged vessels continuing into left wall of uterus. Perimenopausal. Cycles are irregular. Went 3 months without menses, then February menses last a month with bleeding ranging from light to moderate. Complains of hot flashes, anxiety. PCP started Paxil in April but patient felt it made her anxiety worse so she stopped. Normal pap 12/2021. Sexually active.   Patient's last menstrual period was 06/24/2022.    Review of Systems  Constitutional: Negative.   Genitourinary:  Positive for menstrual problem and pelvic pain.       Objective:    Physical Exam Constitutional:      Appearance: Normal appearance.  Genitourinary:    General: Normal vulva.     Vagina: Normal.     Cervix: Normal.     Uterus: Normal.      Adnexa: Right adnexa normal and left adnexa normal.     BP 120/84 (BP Location: Right Arm, Patient Position: Sitting, Cuff Size: Normal)   Pulse 81   Ht 5' (1.524 m)   Wt 125 lb (56.7 kg)   LMP 06/24/2022   SpO2 99%   BMI 24.41 kg/m  Wt Readings from Last 3 Encounters:  10/15/22 125 lb (56.7 kg)  09/10/22 124 lb (56.2 kg)  09/06/22 128 lb (58.1 kg)        Patient informed chaperone available to be present for breast and/or pelvic exam. Patient has requested no chaperone to be present. Patient has been advised what will be completed during breast and pelvic exam.   UPT negative  Assessment & Plan:   Problem List Items Addressed This Visit    None Visit Diagnoses     Pelvic pain    -  Primary   Perimenopausal       Relevant Medications   norethindrone-ethinyl estradiol-FE (LOESTRIN FE) 1-20 MG-MCG tablet   Pelvic congestion syndrome       Irregular menses       Relevant Medications   norethindrone-ethinyl estradiol-FE (LOESTRIN FE) 1-20 MG-MCG tablet   Other Relevant Orders   Pregnancy, urine      Plan: Reviewed imaging from ER. Discussed pelvic congestion syndrome and little that is known about it. COCs have been shown to help in some cases. Also discussed perimenopause and what to expect during this time. Will do low dose COCs. UPT negative today.      Olivia Mackie DNP, 12:16 PM 10/15/2022

## 2023-03-11 ENCOUNTER — Emergency Department (HOSPITAL_COMMUNITY)
Admission: EM | Admit: 2023-03-11 | Discharge: 2023-03-11 | Discharge status: Against Medical Advice | Payer: Medicaid Other | Attending: Emergency Medicine | Admitting: Emergency Medicine

## 2023-03-11 ENCOUNTER — Other Ambulatory Visit: Payer: Self-pay

## 2023-03-11 ENCOUNTER — Emergency Department (HOSPITAL_COMMUNITY): Payer: Medicaid Other

## 2023-03-11 DIAGNOSIS — R059 Cough, unspecified: Secondary | ICD-10-CM | POA: Diagnosis present

## 2023-03-11 DIAGNOSIS — M549 Dorsalgia, unspecified: Secondary | ICD-10-CM | POA: Diagnosis not present

## 2023-03-11 DIAGNOSIS — Z5321 Procedure and treatment not carried out due to patient leaving prior to being seen by health care provider: Secondary | ICD-10-CM | POA: Insufficient documentation

## 2023-03-11 NOTE — ED Provider Triage Note (Signed)
Emergency Medicine Provider Triage Evaluation Note  [name] , a 47 y.o. female  was evaluated in triage.  Pt complains of cough x 3 months, now with back pain, also feels feverish but hasn't checked temp. Spouse relays no menstrual cycle x 3 months, patient unsure if pregnant.  Review of Systems  Positive:  Negative:   Physical Exam  BP 107/80 (BP Location: Right Arm)   Pulse 90   Temp 98.7 F (37.1 C) (Oral)   Resp 16   SpO2 99%  Gen:   Awake, no distress   Resp:  Normal effort  MSK:   Moves extremities without difficulty  Other:  No back tenderness  Medical Decision Making  Medically screening exam initiated at 5:38 PM.  Appropriate orders placed.  [name] was informed that the remainder of the evaluation will be completed by another provider, this initial triage assessment does not replace that evaluation, and the importance of remaining in the ED until their evaluation is complete.     Jeannie Fend, PA-C 03/11/23 1744

## 2023-03-11 NOTE — ED Triage Notes (Signed)
Patient reports cough x 2 months and generalized back pain. Taking Tylenol without relief. LMP 3 months ago.

## 2023-03-11 NOTE — ED Notes (Signed)
Pt left with family due to wait time

## 2023-03-12 LAB — POC URINE PREG, ED: Preg Test, Ur: NEGATIVE

## 2023-03-13 ENCOUNTER — Encounter: Payer: Self-pay | Admitting: Internal Medicine

## 2023-03-13 ENCOUNTER — Ambulatory Visit: Payer: Medicaid Other | Attending: Internal Medicine | Admitting: Internal Medicine

## 2023-03-13 VITALS — BP 105/70 | HR 79 | Temp 97.9°F | Ht 60.0 in | Wt 130.0 lb

## 2023-03-13 DIAGNOSIS — Z23 Encounter for immunization: Secondary | ICD-10-CM | POA: Diagnosis not present

## 2023-03-13 DIAGNOSIS — M2550 Pain in unspecified joint: Secondary | ICD-10-CM | POA: Diagnosis not present

## 2023-03-13 DIAGNOSIS — Z1211 Encounter for screening for malignant neoplasm of colon: Secondary | ICD-10-CM

## 2023-03-13 DIAGNOSIS — M25552 Pain in left hip: Secondary | ICD-10-CM

## 2023-03-13 DIAGNOSIS — M25551 Pain in right hip: Secondary | ICD-10-CM

## 2023-03-13 DIAGNOSIS — R053 Chronic cough: Secondary | ICD-10-CM

## 2023-03-13 DIAGNOSIS — M545 Low back pain, unspecified: Secondary | ICD-10-CM | POA: Diagnosis not present

## 2023-03-13 DIAGNOSIS — J069 Acute upper respiratory infection, unspecified: Secondary | ICD-10-CM | POA: Diagnosis not present

## 2023-03-13 DIAGNOSIS — G8929 Other chronic pain: Secondary | ICD-10-CM

## 2023-03-13 MED ORDER — LORATADINE 10 MG PO TABS: 10.0000 mg | ORAL_TABLET | Freq: Every day | ORAL | 1 refills | Status: AC

## 2023-03-13 MED ORDER — FLUTICASONE PROPIONATE 50 MCG/ACT NA SUSP: 1.0000 | Freq: Every day | NASAL | 0 refills | Status: AC

## 2023-03-13 MED ORDER — MELOXICAM 15 MG PO TABS: 15.0000 mg | ORAL_TABLET | Freq: Every day | ORAL | 0 refills | Status: DC

## 2023-03-13 NOTE — Patient Instructions (Signed)
Stop ibuprofen. We have prescribed a medication called meloxicam for you to take daily to help with arthritis pains. Please go to [facility] imaging at [address] to have x-rays done of the hips and lower back.  We have prescribed Claritin and a nasal spray to help with the chronic cough.

## 2023-03-13 NOTE — Progress Notes (Signed)
Patient ID: [name], female    DOB: 1976/04/09  MRN: [id number]  CC: Sore Throat (Sore throat, swollen lymph nodes, headaches, fever, cough,  X2 mo - concerns that she gets better & suddenly worsens/Joint pain, back pain, finger pain, shoulders X5 yrs )   Subjective: [name] is a 47 y.o. female who presents for chronic ds management. Her concerns today include:   Discussed the use of AI scribe software for clinical note transcription with the patient, who gave verbal consent to proceed.  History of Present Illness   The patient, with a history of chronic cough and joint pain, presents with a persistent cough and mucus production for over two months. The cough seems to improve and then would worsen again. The mucus is described as grayish-green in color. She also reports postnasal drip and recent nasal congestion.  She does not smoke.   She also reports a recent episode of fever, sore throat, and swollen glands 2-3 days ago, which have since improved. When ever her child gets sick, she does as well.  In addition to the cough, the patient reports chronic lower back pain that has worsened over time, particularly after turning 42. She also describes pain in her hips, knees, fingers, elbows and shoulders, with some days being worse than others. The pain is so severe that it has affected her ability to perform daily activities and exercise. She endorses stiffness in her joints, particularly after sitting for extended periods, and difficulty getting up from a seated position. She has not noticed any swelling in the joints The patient also reports frequent headaches, describing a sensation of her head 'going to explode.' The pain is localized at the back of her neck.   HM: Due for flu shot, Pap smear, colon cancer screening.  Family history of rectal cancer in her mother.  Agreeable to getting the flu shot today. Patient Active Problem List   Diagnosis Date Noted   Dysmenorrhea 11/12/2021    Menorrhagia with regular cycle 11/12/2021   SVD (spontaneous vaginal delivery) 04/13/2020   Supervision of low-risk pregnancy, third trimester 04/11/2020   Indication for care in labor and delivery, antepartum 04/11/2020     Current Outpatient Medications on File Prior to Visit  Medication Sig Dispense Refill   acetaminophen (TYLENOL) 325 MG tablet Take 2 tablets (650 mg total) by mouth every 4 (four) hours as needed (for pain scale < 4). 30 tablet 0   ibuprofen (ADVIL) 600 MG tablet Take 1 tablet (600 mg total) by mouth every 6 (six) hours. (Patient not taking: Reported on 03/13/2023) 30 tablet 0   norethindrone-ethinyl estradiol-FE (LOESTRIN FE) 1-20 MG-MCG tablet Take 1 tablet by mouth daily. (Patient not taking: Reported on 03/13/2023) 84 tablet 0   PARoxetine (PAXIL) 10 MG tablet Take 10 mg by mouth daily. Take 1 tbalet (10 MG) by mouth every morning. (Patient not taking: Reported on 10/15/2022)     No current facility-administered medications on file prior to visit.    No Known Allergies  Social History   Socioeconomic History   Marital status: Legally Separated    Spouse name: Not on file   Number of children: 3   Years of education: Not on file   Highest education level: 12th grade  Occupational History   Occupation: Self employed - Land  Tobacco Use   Smoking status: Never   Smokeless tobacco: Never   Tobacco comments:    marijuana last week  Vaping Use  Vaping status: Never Used  Substance and Sexual Activity   Alcohol use: Not Currently   Drug use: Yes    Types: Marijuana    Comment: daily   Sexual activity: Yes    Birth control/protection: None  Other Topics Concern   Not on file  Social History Narrative   Not on file   Social Determinants of Health   Financial Resource Strain: Low Risk  (03/13/2023)   Overall Financial Resource Strain (CARDIA)    Difficulty of Paying Living Expenses: Not very hard  Food Insecurity: No Food Insecurity  (03/13/2023)   Hunger Vital Sign    Worried About Running Out of Food in the Last Year: Never true    Ran Out of Food in the Last Year: Never true  Transportation Needs: No Transportation Needs (03/13/2023)   PRAPARE - Administrator, Civil Service (Medical): No    Lack of Transportation (Non-Medical): No  Physical Activity: Inactive (03/13/2023)   Exercise Vital Sign    Days of Exercise per Week: 0 days    Minutes of Exercise per Session: 0 min  Stress: No Stress Concern Present (03/13/2023)   [employer] of Occupational Health - Occupational Stress Questionnaire    Feeling of Stress : Not at all  Social Connections: Socially Isolated (03/13/2023)   Social Connection and Isolation Panel [NHANES]    Frequency of Communication with Friends and Family: Once a week    Frequency of Social Gatherings with Friends and Family: More than three times a week    Attends Religious Services: Never    Database administrator or Organizations: No    Attends Banker Meetings: Never    Marital Status: Separated  Intimate Partner Violence: Not At Risk (03/13/2023)   Humiliation, Afraid, Rape, and Kick questionnaire    Fear of Current or Ex-Partner: No    Emotionally Abused: No    Physically Abused: No    Sexually Abused: No    Family History  Problem Relation Age of Onset   Hyperthyroidism Mother    Colon cancer Mother    Diabetes Maternal Grandmother    Diabetes Paternal Grandfather    Breast cancer Neg Hx     Past Surgical History:  Procedure Laterality Date   CHOLECYSTECTOMY N/A 10/27/2018   Procedure: LAPAROSCOPIC CHOLECYSTECTOMY;  Surgeon: Harriette Bouillon, MD;  Location: [facility] OR;  Service: General;  Laterality: N/A;   WISDOM TOOTH EXTRACTION      ROS: Review of Systems Negative except as stated above  PHYSICAL EXAM: BP 105/70 (BP Location: Left Arm, Patient Position: Sitting, Cuff Size: Normal)   Pulse 79   Temp 97.9 F (36.6 C) (Oral)   Ht 5'  (1.524 m)   Wt 130 lb (59 kg)   LMP 12/11/2022 (Approximate)   SpO2 97%   BMI 25.39 kg/m   Physical Exam  General appearance - alert, well appearing, and in no distress Mental status -patient is very talkative and jumps around.  Has to be redirected intermittently Nose - normal and patent, no erythema, discharge or polyps Mouth - mucous membranes moist, pharynx normal without lesions Neck - supple, no significant adenopathy Lymphatics -no cervical lymphadenopathy.  Submandibular salivary glands are not enlarged. Chest - clear to auscultation, no wheezes, rales or rhonchi, symmetric air entry Heart - normal rate, regular rhythm, normal S1, S2, no murmurs, rubs, clicks or gallops Musculoskeletal -noted to have mild enlargement of the distal joints on both hands.  No enlargement or inflammation  noted of the MCP or PIP joints.  She has good range of motion of the fingers, wrist, elbows, shoulders, knees and hips.  No joint enlargement noted of the wrists, elbows, shoulders, knees and hips      Latest Ref Rng & Units 09/06/2022   11:06 PM 11/12/2021    2:55 PM 10/26/2018    9:16 AM  CMP  Glucose 70 - 99 mg/dL 161  90  80   BUN 6 - 20 mg/dL 14  25  11    Creatinine 0.44 - 1.00 mg/dL 0.96  0.45  4.09   Sodium 135 - 145 mmol/L 137  140  141   Potassium 3.5 - 5.1 mmol/L 4.0  4.4  4.8   Chloride 98 - 111 mmol/L 107  103  111   CO2 22 - 32 mmol/L 22  23  23    Calcium 8.9 - 10.3 mg/dL 8.8  9.4  9.1   Total Protein 6.5 - 8.1 g/dL 5.7  6.8  6.3   Total Bilirubin 0.3 - 1.2 mg/dL 0.2  0.3  1.0   Alkaline Phos 38 - 126 U/L 38  62  36   AST 15 - 41 U/L 20  42  30   ALT 0 - 44 U/L 23  30  18     Lipid Panel     Component Value Date/Time   CHOL 182 11/12/2021 1455   TRIG 184 (H) 11/12/2021 1455   HDL 52 11/12/2021 1455   CHOLHDL 3.5 11/12/2021 1455   LDLCALC 98 11/12/2021 1455    CBC    Component Value Date/Time   WBC 6.1 09/06/2022 2306   RBC 3.78 (L) 09/06/2022 2306   HGB 11.2 (L)  09/06/2022 2306   HGB 12.6 11/12/2021 1455   HCT 34.6 (L) 09/06/2022 2306   HCT 37.6 11/12/2021 1455   PLT 261 09/06/2022 2306   PLT 306 11/12/2021 1455   MCV 91.5 09/06/2022 2306   MCV 90 11/12/2021 1455   MCH 29.6 09/06/2022 2306   MCHC 32.4 09/06/2022 2306   RDW 12.6 09/06/2022 2306   RDW 13.0 11/12/2021 1455   LYMPHSABS 1.8 10/26/2018 0916   MONOABS 0.6 10/26/2018 0916   EOSABS 0.1 10/26/2018 0916   BASOSABS 0.0 10/26/2018 0916    ASSESSMENT AND PLAN: 1. URI, acute Patient reports acute symptoms that occurred 2 to 3 days ago but have since resolved.  Most likely was an acute viral infection.  2. Chronic cough Sound symptoms of postnasal drip.  We will try her with Claritin and Flonase nasal spray. - loratadine (CLARITIN) 10 MG tablet; Take 1 tablet (10 mg total) by mouth daily.  Dispense: 90 tablet; Refill: 1 - fluticasone (FLONASE) 50 MCG/ACT nasal spray; Place 1 spray into both nostrils daily.  Dispense: 16 g; Refill: 0  3. Polyarthralgia Patient presenting with polyarthralgia.  No signs of enlargement or tenosynovitis of the joints.  However we will do some baseline blood test including sed rate, ANA and rheumatoid factor.  Will try her with meloxicam.  Since the lower back and hips are most bothersome, we will get x-rays of the lumbar spine and the hips. - meloxicam (MOBIC) 15 MG tablet; Take 1 tablet (15 mg total) by mouth daily.  Dispense: 30 tablet; Refill: 0 - Sedimentation Rate - ANA w/Reflex - Rheumatoid factor - CYCLIC CITRUL PEPTIDE ANTIBODY, IGG/IGA  4. Chronic bilateral low back pain without sciatica See #5 above.  Could be due to DDD versus DJD. - DG Lumbar  Spine Complete; Future  5. Chronic pain of both hips See #5 above. - DG Hip Unilat W OR W/O Pelvis Min 4 Views Right; Future - DG Hip Unilat W OR W/O Pelvis Min 4 Views Left; Future  6. Screening for colon cancer - Ambulatory referral to Gastroenterology  7. Encounter for immunization - Flu  vaccine trivalent PF, 6mos and older(Flulaval,Afluria,Fluarix,Fluzone)    Patient was given the opportunity to ask questions.  Patient verbalized understanding of the plan and was able to repeat key elements of the plan.   This documentation was completed using Paediatric nurse.  Any transcriptional errors are unintentional.  No orders of the defined types were placed in this encounter.    Requested Prescriptions    No prescriptions requested or ordered in this encounter    No follow-ups on file.  Jonah Blue, MD, FACP

## 2023-03-17 LAB — CYCLIC CITRUL PEPTIDE ANTIBODY, IGG/IGA: Cyclic Citrullin Peptide Ab: 6 U (ref 0–19)

## 2023-03-17 LAB — RHEUMATOID FACTOR: Rheumatoid fact SerPl-aCnc: 10.1 [IU]/mL (ref <14.0)

## 2023-03-17 LAB — ANA W/REFLEX: ANA Titer 1: NEGATIVE

## 2023-03-17 LAB — SEDIMENTATION RATE: Sed Rate: 8 mm/h (ref 0–32)

## 2024-02-24 ENCOUNTER — Telehealth: Payer: Self-pay | Admitting: Internal Medicine

## 2024-02-24 NOTE — Telephone Encounter (Signed)
 Caled pt to confirm appt. Pt did not answer and LVM for pt to return call so that we can confirm appt.

## 2024-02-25 ENCOUNTER — Encounter: Payer: Self-pay | Admitting: Nurse Practitioner

## 2024-02-25 ENCOUNTER — Ambulatory Visit: Attending: Nurse Practitioner | Admitting: Nurse Practitioner

## 2024-02-25 VITALS — BP 106/73 | HR 72 | Resp 19 | Ht 60.0 in | Wt 135.0 lb

## 2024-02-25 DIAGNOSIS — N951 Menopausal and female climacteric states: Secondary | ICD-10-CM

## 2024-02-25 DIAGNOSIS — F419 Anxiety disorder, unspecified: Secondary | ICD-10-CM

## 2024-02-25 DIAGNOSIS — Z23 Encounter for immunization: Secondary | ICD-10-CM | POA: Diagnosis not present

## 2024-02-25 DIAGNOSIS — Z1231 Encounter for screening mammogram for malignant neoplasm of breast: Secondary | ICD-10-CM

## 2024-02-25 MED ORDER — HYDROXYZINE PAMOATE 25 MG PO CAPS: 25.0000 mg | ORAL_CAPSULE | Freq: Every day | ORAL | 1 refills | Status: DC | PRN

## 2024-02-25 NOTE — Progress Notes (Signed)
 Assessment & Plan:  [name] was seen today for hot flashes, amenorrhea and joint pain.  Diagnoses and all orders for this visit:  Anxiety -     Thyroid Panel With TSH START HYDROXYZINE -     hydrOXYzine (VISTARIL) 25 MG capsule; Take 1-2 capsules (25-50 mg total) by mouth daily as needed.  Need for influenza vaccination -     Flu vaccine trivalent PF, 6mos and older(Flulaval,Afluria,Fluarix,Fluzone)  Breast cancer screening by mammogram -     Cancel: MS 3D SCR MAMMO BILAT BR (aka MM); Future -     MM 3D SCREENING MAMMOGRAM BILATERAL BREAST; Future  Perimenopause F/U with GYN   Patient has been counseled on age-appropriate routine health concerns for screening and prevention. These are reviewed and up-to-date. Referrals have been placed accordingly. Immunizations are up-to-date or declined.    Subjective:   Chief Complaint  Patient presents with   Hot Flashes   Amenorrhea   Joint Pain    [name] is seeing me today for an acute visit. She is a patient of Dr. Vicci   She experiencing symptoms of generalized joint pain, anxiety, mood instability, hot flashes, insomnia, panic attacks,  and irregular menstrual cycles. LMP was several months ago.   She would like to know if her symptoms are related to menopause.     Review of Systems  Constitutional:  Positive for diaphoresis. Negative for fever, malaise/fatigue and weight loss.       SEE HPI  Respiratory: Negative.  Negative for cough and shortness of breath.   Cardiovascular: Negative.  Negative for chest pain, palpitations and leg swelling.  Gastrointestinal: Negative.  Negative for heartburn, nausea and vomiting.  Genitourinary:        AUB  Musculoskeletal:  Positive for joint pain. Negative for myalgias.       SEE HPI  Neurological: Negative.  Negative for dizziness, focal weakness, seizures and headaches.  Psychiatric/Behavioral:  Negative for suicidal ideas. The patient is nervous/anxious.        SEE HPI     Past Medical History:  Diagnosis Date   Anemia    Asthma    childhood - no longer issue per pt   Cholecystitis 2020   Depression     Past Surgical History:  Procedure Laterality Date   CHOLECYSTECTOMY N/A 10/27/2018   Procedure: LAPAROSCOPIC CHOLECYSTECTOMY;  Surgeon: Vanderbilt Ned, MD;  Location: [facility] OR;  Service: General;  Laterality: N/A;   WISDOM TOOTH EXTRACTION      Family History  Problem Relation Age of Onset   Hyperthyroidism Mother    Colon cancer Mother    Diabetes Maternal Grandmother    Diabetes Paternal Grandfather    Breast cancer Neg Hx     Social History Reviewed with no changes to be made today.   Outpatient Medications Prior to Visit  Medication Sig Dispense Refill   acetaminophen  (TYLENOL ) 325 MG tablet Take 2 tablets (650 mg total) by mouth every 4 (four) hours as needed (for pain scale < 4). 30 tablet 0   fluticasone  (FLONASE ) 50 MCG/ACT nasal spray Place 1 spray into both nostrils daily. 16 g 0   loratadine  (CLARITIN ) 10 MG tablet Take 1 tablet (10 mg total) by mouth daily. 90 tablet 1   meloxicam  (MOBIC ) 15 MG tablet Take 1 tablet (15 mg total) by mouth daily. (Patient not taking: Reported on 02/25/2024) 30 tablet 0   norethindrone-ethinyl estradiol-FE (LOESTRIN FE) 1-20 MG-MCG tablet Take 1 tablet by mouth daily. (Patient not  taking: Reported on 02/25/2024) 84 tablet 0   PARoxetine (PAXIL) 10 MG tablet Take 10 mg by mouth daily. Take 1 tbalet (10 MG) by mouth every morning. (Patient not taking: Reported on 02/25/2024)     No facility-administered medications prior to visit.    No Known Allergies     Objective:    BP 106/73 (BP Location: Left Arm, Patient Position: Sitting, Cuff Size: Normal)   Pulse 72   Resp 19   Ht 5' (1.524 m)   Wt 135 lb (61.2 kg)   LMP 07/26/2023 (Approximate)   SpO2 100%   BMI 26.37 kg/m  Wt Readings from Last 3 Encounters:  03/03/24 167 lb (75.8 kg)  02/25/24 135 lb (61.2 kg)  03/13/23 130 lb (59 kg)     Physical Exam Vitals and nursing note reviewed.  Constitutional:      Appearance: She is well-developed.  HENT:     Head: Normocephalic and atraumatic.  Cardiovascular:     Rate and Rhythm: Normal rate and regular rhythm.     Heart sounds: Normal heart sounds. No murmur heard.    No friction rub. No gallop.  Pulmonary:     Effort: Pulmonary effort is normal. No tachypnea or respiratory distress.     Breath sounds: Normal breath sounds. No decreased breath sounds, wheezing, rhonchi or rales.  Chest:     Chest wall: No tenderness.  Musculoskeletal:        General: Normal range of motion.     Cervical back: Normal range of motion.  Skin:    General: Skin is warm and dry.  Neurological:     Mental Status: She is alert and oriented to person, place, and time.     Coordination: Coordination normal.  Psychiatric:        Behavior: Behavior normal. Behavior is cooperative.        Thought Content: Thought content normal.        Judgment: Judgment normal.          Patient has been counseled extensively about nutrition and exercise as well as the importance of adherence with medications and regular follow-up. The patient was given clear instructions to go to ER or return to medical center if symptoms don't improve, worsen or new problems develop. The patient verbalized understanding.   Follow-up: Return if symptoms worsen or fail to improve.   Haze LELON Servant, FNP-BC [facility] [place], [place] [phone]   03/21/2024, 9:14 PM

## 2024-02-25 NOTE — Patient Instructions (Addendum)
 [facility]  [address] [place], [place] [address] PH# [phone]  [facility] Address: [address], [place], [place] [address] Phone: [phone]

## 2024-02-26 LAB — THYROID PANEL WITH TSH
Free Thyroxine Index: 1.9 (ref 1.2–4.9)
T3 Uptake Ratio: 23 % — ABNORMAL LOW (ref 24–39)
T4, Total: 8.3 ug/dL (ref 4.5–12.0)
TSH: 1.14 u[IU]/mL (ref 0.450–4.500)

## 2024-03-03 ENCOUNTER — Ambulatory Visit: Payer: Self-pay | Admitting: Nurse Practitioner

## 2024-03-03 ENCOUNTER — Encounter: Payer: Self-pay | Admitting: Nurse Practitioner

## 2024-03-03 ENCOUNTER — Ambulatory Visit: Admitting: Nurse Practitioner

## 2024-03-03 VITALS — BP 120/78 | HR 99 | Wt 167.0 lb

## 2024-03-03 DIAGNOSIS — N912 Amenorrhea, unspecified: Secondary | ICD-10-CM

## 2024-03-03 DIAGNOSIS — Z79890 Hormone replacement therapy (postmenopausal): Secondary | ICD-10-CM | POA: Diagnosis not present

## 2024-03-03 DIAGNOSIS — N951 Menopausal and female climacteric states: Secondary | ICD-10-CM

## 2024-03-03 LAB — PREGNANCY, URINE: Preg Test, Ur: NEGATIVE

## 2024-03-03 MED ORDER — ESTRADIOL 0.025 MG/24HR TD PTWK: 0.0250 mg | MEDICATED_PATCH | TRANSDERMAL | 1 refills | Status: DC

## 2024-03-03 MED ORDER — PROGESTERONE MICRONIZED 100 MG PO CAPS: 100.0000 mg | ORAL_CAPSULE | Freq: Every evening | ORAL | 1 refills | Status: DC

## 2024-03-03 NOTE — Progress Notes (Signed)
   Acute Office Visit  Subjective:    Patient ID: [name], female    DOB: 06-Apr-1976, 48 y.o.   MRN: [id number]   HPI 48 y.o. presents today for amenorrhea and multiple symptoms. LMP in March. Normal thyroid panel 02/25/24. Complains of generalized joint pain, hot flashes, night sweats, poor sleep, feeling itchy all over at night, fatigue, weight gain, low libido, worsening anxiety, panic attacks. Takes hydroxyzine daily as needed.   Patient's last menstrual period was 07/26/2023 (approximate).    Review of Systems  Constitutional:  Positive for fatigue.  Endocrine: Positive for heat intolerance.  Musculoskeletal:  Positive for arthralgias.  Skin:        Itching  Psychiatric/Behavioral:  Positive for sleep disturbance. The patient is nervous/anxious.        Objective:    Physical Exam Constitutional:      Appearance: Normal appearance.   GU: Not indicated  BP 120/78   Pulse 99   Wt 167 lb (75.8 kg)   LMP 07/26/2023 (Approximate)   SpO2 99%   BMI 32.61 kg/m  Wt Readings from Last 3 Encounters:  03/03/24 167 lb (75.8 kg)  02/25/24 135 lb (61.2 kg)  03/13/23 130 lb (59 kg)       UPT neg  Assessment & Plan:   Problem List Items Addressed This Visit   None Visit Diagnoses       Amenorrhea    -  Primary   Relevant Orders   Pregnancy, urine   Estradiol   Follicle stimulating hormone     Perimenopausal symptoms       Relevant Medications   estradiol (CLIMARA) 0.025 mg/24hr patch   progesterone (PROMETRIUM) 100 MG capsule   Other Relevant Orders   Estradiol   Follicle stimulating hormone     Hormone replacement therapy (HRT)       Relevant Medications   estradiol (CLIMARA) 0.025 mg/24hr patch   progesterone (PROMETRIUM) 100 MG capsule      Plan: UPT negative. Discussed perimenopause and what to expect. Discussed management with OTC supplements, SSRI/SNRIs, HRT. Interested in HRT. Educated on proper use, risks and benefits of use. FSH, estradiol  pending. Aware levels may not be very informative.   Return in about 4 weeks (around 03/31/2024) for Med follow up.    Annabella DELENA Shutter DNP, 1:56 PM 03/03/2024

## 2024-03-04 ENCOUNTER — Ambulatory Visit: Payer: Self-pay | Admitting: Nurse Practitioner

## 2024-03-04 LAB — ESTRADIOL: Estradiol: 59 pg/mL

## 2024-03-04 LAB — FOLLICLE STIMULATING HORMONE: FSH: 33.2 m[IU]/mL

## 2024-03-05 ENCOUNTER — Ambulatory Visit (HOSPITAL_COMMUNITY)
Admission: EM | Admit: 2024-03-05 | Discharge: 2024-03-05 | Disposition: A | Discharge status: Home / Self Care | Attending: Emergency Medicine | Admitting: Emergency Medicine

## 2024-03-05 ENCOUNTER — Encounter (HOSPITAL_COMMUNITY): Payer: Self-pay | Admitting: Emergency Medicine

## 2024-03-05 ENCOUNTER — Ambulatory Visit (HOSPITAL_COMMUNITY)

## 2024-03-05 ENCOUNTER — Ambulatory Visit (INDEPENDENT_AMBULATORY_CARE_PROVIDER_SITE_OTHER)

## 2024-03-05 DIAGNOSIS — S99911A Unspecified injury of right ankle, initial encounter: Secondary | ICD-10-CM

## 2024-03-05 DIAGNOSIS — S93401A Sprain of unspecified ligament of right ankle, initial encounter: Secondary | ICD-10-CM

## 2024-03-05 MED ORDER — CYCLOBENZAPRINE HCL 10 MG PO TABS: 10.0000 mg | ORAL_TABLET | Freq: Every evening | ORAL | 0 refills | Status: AC | PRN

## 2024-03-05 MED ORDER — IBUPROFEN 800 MG PO TABS
ORAL_TABLET | ORAL | Status: AC
  Filled 2024-03-05: qty 1

## 2024-03-05 MED ORDER — IBUPROFEN 800 MG PO TABS
800.0000 mg | ORAL_TABLET | Freq: Once | ORAL | Status: AC
  Administered 2024-03-05: 800 mg via ORAL

## 2024-03-05 NOTE — Discharge Instructions (Addendum)
 Xray does not show any broken bones! You have likely sprained your ankle Continue ibuprofen  for pain and swelling. Apply ice for 20 minutes a few times daily. Use ace wrap for support and compression. Prop foot up on a pillow.  Please follow up with orthopedics if your symptoms have not improved after the next week

## 2024-03-05 NOTE — ED Triage Notes (Addendum)
 Pt reports didn't see a stair yesterday and rigth ankle rolled causing her to fall. Pt has swelling and bruising to right ankle. Pt c/o pain in half my body, arms, hips, ankle, and back. Took tylenol 

## 2024-03-05 NOTE — ED Provider Notes (Signed)
 [facility]    CSN: [id number] Arrival date & time: 03/05/24  1747      History   Chief Complaint Chief Complaint  Patient presents with   Ankle Pain    HPI [name] is a 47 y.o. female.  Right ankle injury occurred yesterday Stepped off stair, rolled the ankle and fell Having swelling and bruising right ankle. Has not seen other injuries on the body. Some low back soreness with sitting.  Took tylenol  without much improvement   Has sprained ankle in the past   Past Medical History:  Diagnosis Date   Anemia    Asthma    childhood - no longer issue per pt   Cholecystitis 2020   Depression     Patient Active Problem List   Diagnosis Date Noted   Chronic cough 03/13/2023   Polyarthralgia 03/13/2023   Chronic bilateral low back pain without sciatica 03/13/2023   Chronic pain of both hips 03/13/2023   Dysmenorrhea 11/12/2021   Menorrhagia with regular cycle 11/12/2021   SVD (spontaneous vaginal delivery) 04/13/2020   Supervision of low-risk pregnancy, third trimester 04/11/2020   Indication for care in labor and delivery, antepartum 04/11/2020    Past Surgical History:  Procedure Laterality Date   CHOLECYSTECTOMY N/A 10/27/2018   Procedure: LAPAROSCOPIC CHOLECYSTECTOMY;  Surgeon: Vanderbilt Ned, MD;  Location: [facility] OR;  Service: General;  Laterality: N/A;   WISDOM TOOTH EXTRACTION      OB History     Gravida  6   Para  4   Term  4   Preterm      AB  2   Living  4      SAB      IAB  2   Ectopic      Multiple  0   Live Births  1            Home Medications    Prior to Admission medications   Medication Sig Start Date End Date Taking? Authorizing Provider  cyclobenzaprine (FLEXERIL) 10 MG tablet Take 1 tablet (10 mg total) by mouth at bedtime as needed for muscle spasms. 03/05/24  Yes Eldene Plocher, Asberry, PA-C  acetaminophen  (TYLENOL ) 325 MG tablet Take 2 tablets (650 mg total) by mouth every 4 (four) hours as needed (for pain  scale < 4). 04/14/20   Leftwich-Kirby, Olam LABOR, CNM  estradiol (CLIMARA) 0.025 mg/24hr patch Place 1 patch (0.025 mg total) onto the skin once a week. 03/03/24   Prentiss Annabella LABOR, NP  fluticasone  (FLONASE ) 50 MCG/ACT nasal spray Place 1 spray into both nostrils daily. 03/13/23   Vicci Barnie NOVAK, MD  hydrOXYzine (VISTARIL) 25 MG capsule Take 1-2 capsules (25-50 mg total) by mouth daily as needed. 02/25/24   Fleming, Zelda W, NP  loratadine  (CLARITIN ) 10 MG tablet Take 1 tablet (10 mg total) by mouth daily. 03/13/23   Vicci Barnie NOVAK, MD  progesterone (PROMETRIUM) 100 MG capsule Take 1 capsule (100 mg total) by mouth at bedtime. 03/03/24   Prentiss Annabella LABOR, NP    Family History Family History  Problem Relation Age of Onset   Hyperthyroidism Mother    Colon cancer Mother    Diabetes Maternal Grandmother    Diabetes Paternal Grandfather    Breast cancer Neg Hx     Social History Social History   Tobacco Use   Smoking status: Never   Smokeless tobacco: Never  Vaping Use   Vaping status: Never Used  Substance Use Topics  Alcohol use: Not Currently   Drug use: Not Currently     Allergies   Patient has no known allergies.   Review of Systems Review of Systems  As per HPI  Physical Exam Triage Vital Signs ED Triage Vitals  Encounter Vitals Group     BP 03/05/24 1901 96/71     Girls Systolic BP Percentile --      Girls Diastolic BP Percentile --      Boys Systolic BP Percentile --      Boys Diastolic BP Percentile --      Pulse Rate 03/05/24 1901 86     Resp 03/05/24 1901 17     Temp 03/05/24 1901 98.5 F (36.9 C)     Temp Source 03/05/24 1901 Oral     SpO2 03/05/24 1901 97 %     Weight --      Height --      Head Circumference --      Peak Flow --      Pain Score 03/05/24 1900 8     Pain Loc --      Pain Education --      Exclude from Growth Chart --    No data found.  Updated Vital Signs BP 96/71 (BP Location: Left Arm)   Pulse 86   Temp 98.5 F  (36.9 C) (Oral)   Resp 17   LMP 07/26/2023 (Approximate)   SpO2 97%    Physical Exam Vitals and nursing note reviewed.  Constitutional:      General: She is not in acute distress. HENT:     Mouth/Throat:     Pharynx: Oropharynx is clear.  Cardiovascular:     Rate and Rhythm: Normal rate and regular rhythm.     Pulses: Normal pulses.  Pulmonary:     Effort: Pulmonary effort is normal.  Musculoskeletal:        General: Swelling and tenderness present.     Cervical back: Normal range of motion.     Comments: Right ankle swollen laterally with tenderness and bruising. Distal sensation intact. Strong DP and PT pulses. Cap refill < 2 seconds  Skin:    Capillary Refill: Capillary refill takes less than 2 seconds.  Neurological:     Mental Status: She is alert and oriented to person, place, and time.     UC Treatments / Results  Labs (all labs ordered are listed, but only abnormal results are displayed) Labs Reviewed - No data to display  EKG  Radiology DG Ankle Complete Right Result Date: 03/05/2024 CLINICAL DATA:  injury with swelling and bruising EXAM: RIGHT ANKLE - COMPLETE 3+ VIEW COMPARISON:  None Available. FINDINGS: No acute fracture or dislocation. No ankle mortise widening. The talar dome is intact. There is no evidence of arthropathy or other focal bone abnormality. Moderate soft tissue swelling about the ankle. IMPRESSION: Moderate soft tissue swelling about the ankle. No acute fracture or dislocation. Electronically Signed   By: Rogelia Myers M.D.   On: 03/05/2024 19:46    Procedures Procedures   Medications Ordered in UC Medications  ibuprofen  (ADVIL ) tablet 800 mg (800 mg Oral Given 03/05/24 1957)    Initial Impression / Assessment and Plan / UC Course  I have reviewed the triage vital signs and the nursing notes.  Pertinent labs & imaging results that were available during my care of the patient were reviewed by me and considered in my medical decision  making (see chart for details).  Ibuprofen  dose given  for pain Right ankle xray negative. Images independently reviewed by me, agree with radiology interpretation. Ace wrap applied in clinic. RICE therapy and home pain control.  Ortho follow up if needed   Muscle relaxer sent for body aches. Drowsy precautions  Final Clinical Impressions(s) / UC Diagnoses   Final diagnoses:  Injury of right ankle, initial encounter  Sprain of right ankle, unspecified ligament, initial encounter     Discharge Instructions      Xray does not show any broken bones! You have likely sprained your ankle Continue ibuprofen  for pain and swelling. Apply ice for 20 minutes a few times daily. Use ace wrap for support and compression. Prop foot up on a pillow.  Please follow up with orthopedics if your symptoms have not improved after the next week      ED Prescriptions     Medication Sig Dispense Auth. Provider   cyclobenzaprine (FLEXERIL) 10 MG tablet Take 1 tablet (10 mg total) by mouth at bedtime as needed for muscle spasms. 10 tablet Adasha Boehme, Asberry, PA-C      I have reviewed the PDMP during this encounter.   Lerline Valdivia, Asberry RIGGERS 03/05/24 2002

## 2024-03-17 ENCOUNTER — Ambulatory Visit
Admission: RE | Admit: 2024-03-17 | Discharge: 2024-03-17 | Disposition: A | Source: Ambulatory Visit | Attending: Nurse Practitioner

## 2024-03-17 DIAGNOSIS — Z1231 Encounter for screening mammogram for malignant neoplasm of breast: Secondary | ICD-10-CM

## 2024-03-18 ENCOUNTER — Ambulatory Visit (INDEPENDENT_AMBULATORY_CARE_PROVIDER_SITE_OTHER): Admitting: Nurse Practitioner

## 2024-03-18 ENCOUNTER — Encounter: Payer: Self-pay | Admitting: Nurse Practitioner

## 2024-03-18 VITALS — BP 122/76 | HR 105

## 2024-03-18 DIAGNOSIS — Z79890 Hormone replacement therapy (postmenopausal): Secondary | ICD-10-CM

## 2024-03-18 DIAGNOSIS — N951 Menopausal and female climacteric states: Secondary | ICD-10-CM | POA: Diagnosis not present

## 2024-03-18 NOTE — Progress Notes (Signed)
   Acute Office Visit  Subjective:    Patient ID: [name], female    DOB: [birth year], 48 y.o.   MRN: [id number]   HPI 48 y.o. presents today for HRT follow up. Started low dose estradiol patch 0.025 mg weekly and Prometrium 100 mg nightly a couple of weeks ago. LMP in March but did start menses a couple of days ago. Has noticed improvement in symptoms but would like to increase.   Patient's last menstrual period was 03/16/2024 (exact date). Menstrual Flow: Light Menstrual Control: Maxi pad Dysmenorrhea: (!) Moderate Dysmenorrhea Symptoms: Cramping  Review of Systems  Constitutional:  Positive for fatigue.  Endocrine: Positive for heat intolerance.  Musculoskeletal:  Positive for myalgias.  Psychiatric/Behavioral:  Positive for sleep disturbance. The patient is nervous/anxious.        Objective:    Physical Exam Constitutional:      Appearance: Normal appearance.     BP 122/76 (BP Location: Left Arm, Patient Position: Sitting)   Pulse (!) 105   LMP 03/16/2024 (Exact Date)   SpO2 98%  Wt Readings from Last 3 Encounters:  03/03/24 167 lb (75.8 kg)  02/25/24 135 lb (61.2 kg)  03/13/23 130 lb (59 kg)        Assessment & Plan:   Problem List Items Addressed This Visit   None Visit Diagnoses       Perimenopausal symptoms    -  Primary     Hormone replacement therapy (HRT)          Plan: Recommend giving current dose a couple more weeks. If no improvement will increase estradiol patch.  Return if symptoms worsen or fail to improve.    Mallory DELENA Shutter DNP, 1:57 PM 03/18/2024

## 2024-03-29 ENCOUNTER — Telehealth: Payer: Self-pay | Admitting: Internal Medicine

## 2024-03-29 ENCOUNTER — Telehealth: Payer: Self-pay

## 2024-03-29 ENCOUNTER — Other Ambulatory Visit: Payer: Self-pay | Admitting: Nurse Practitioner

## 2024-03-29 DIAGNOSIS — N951 Menopausal and female climacteric states: Secondary | ICD-10-CM

## 2024-03-29 DIAGNOSIS — Z79890 Hormone replacement therapy (postmenopausal): Secondary | ICD-10-CM

## 2024-03-29 DIAGNOSIS — H538 Other visual disturbances: Secondary | ICD-10-CM

## 2024-03-29 MED ORDER — PROGESTERONE MICRONIZED 100 MG PO CAPS: 100.0000 mg | ORAL_CAPSULE | Freq: Every evening | ORAL | 1 refills | Status: DC

## 2024-03-29 MED ORDER — ESTRADIOL 0.0375 MG/24HR TD PTWK: 0.0375 mg | MEDICATED_PATCH | TRANSDERMAL | 1 refills | Status: DC

## 2024-03-29 NOTE — Telephone Encounter (Signed)
 Copied from CRM [id number]. Topic: Referral - Question >> Mar 29, 2024  2:08 PM Kevelyn M wrote:  Reason for CRM: Patient would like to be referred to an optometrist.  Call back: [phone]

## 2024-03-29 NOTE — Addendum Note (Signed)
 Addended by: VICCI SOBER B on: 03/29/2024 09:42 PM   Modules accepted: Orders

## 2024-03-29 NOTE — Telephone Encounter (Signed)
 Copied from CRM [id number]. Topic: Referral - Request for Referral >> Mar 29, 2024  2:11 PM Tiffini S wrote:  Did the patient discuss referral with their provider in the last year? No (If No - schedule appointment) (If Yes - send message)  Appointment offered? Yes  Type of order/referral and detailed reason for visit: Optometrist- blurry vision   Preference of office, provider, location: N/A  If referral order, have you been seen by this specialty before? No (If Yes, this issue or another issue? When? Where?  Can we respond through MyChart? Yes

## 2024-03-29 NOTE — Telephone Encounter (Signed)
 Please reach out to pt to schedule a AEX per Tiffany.   Thanks,   Geni

## 2024-03-29 NOTE — Telephone Encounter (Signed)
 Referral submitted

## 2024-03-29 NOTE — Telephone Encounter (Signed)
 New prescription sent. Needs annual exam.

## 2024-03-29 NOTE — Telephone Encounter (Signed)
 Pt called in the refill line asking for a refill on the estradiol patch, she states that at her last visit it was going to be increased.   Please advise   Pt can be reached at [phone]

## 2024-03-30 NOTE — Telephone Encounter (Signed)
 Patient is aware that referral was placed.  ~ 2 weeks to hear from referring office regarding an appointment.

## 2024-04-21 ENCOUNTER — Ambulatory Visit: Payer: Self-pay

## 2024-04-21 NOTE — Telephone Encounter (Signed)
 noted

## 2024-04-21 NOTE — Telephone Encounter (Signed)
 FYI Only or Action Required?: FYI only for provider: UC.  Patient was last seen in primary care on 02/25/2024 by Theotis Haze ORN, NP.  Called Nurse Triage reporting Rash.  Symptoms began several days ago.  Interventions attempted: Other: topical cream.  Symptoms are: gradually worsening.  Triage Disposition: See PCP When Office is Open (Within 3 Days)  Patient/caregiver understands and will follow disposition?:  Reason for Disposition  [1] Severe localized itching AND [2] after 2 days of steroid cream  Answer Assessment - Initial Assessment Questions Recently applied polygel to her nails 2 days ago and has since developed an allergic reaction on her hands. Also has rash and pruritus on abdomen where she put the tube of polygel to warm up. Is worse at night. Has not removed nail product.  1. APPEARANCE of RASH: What does the rash look like? (e.g., blisters, dry flaky skin, red spots, redness, sores)     Blistering and red bumps/rash present all over hands 2. LOCATION: Where is the rash located?      Bilateral hands and fingertips 5. ONSET: When did the rash start?      2 days 6. ITCHING: Does the rash itch? If Yes, ask: How bad is the itch?  (Scale 0-10; or none, mild, moderate, severe)     Severe 7. PAIN: Does the rash hurt? If Yes, ask: How bad is the pain?  (Scale 0-10; or none, mild, moderate, severe)     Painful from her scratching 8. OTHER SYMPTOMS: Do you have any other symptoms? (e.g., fever)     States she felt chills yesterday, did not check temperature  Protocols used: Rash or Redness - Localized-A-AH Copied from CRM #[id number]. Topic: Clinical - Red Word Triage >> Apr 21, 2024 11:30 AM Mesmerise C wrote: [facility] that prompted transfer to Nurse Triage: Patient believes to have allergic reaction or dermatitis states there's swelling and itching, pain due to blisters also discomfort been ongoing for 3 days

## 2024-04-22 ENCOUNTER — Ambulatory Visit: Attending: Internal Medicine | Admitting: Internal Medicine

## 2024-04-22 ENCOUNTER — Encounter: Payer: Self-pay | Admitting: Internal Medicine

## 2024-04-22 ENCOUNTER — Other Ambulatory Visit: Payer: Self-pay

## 2024-04-22 VITALS — BP 97/65 | HR 103 | Temp 98.0°F | Ht 60.0 in | Wt 172.0 lb

## 2024-04-22 DIAGNOSIS — L235 Allergic contact dermatitis due to other chemical products: Secondary | ICD-10-CM

## 2024-04-22 MED ORDER — TRIAMCINOLONE ACETONIDE 0.1 % EX CREA
1.0000 | TOPICAL_CREAM | Freq: Two times a day (BID) | CUTANEOUS | 0 refills | Status: AC
  Filled 2024-04-22: qty 30, 60d supply, fill #0

## 2024-04-22 MED ORDER — PREDNISONE 20 MG PO TABS
20.0000 mg | ORAL_TABLET | Freq: Every day | ORAL | 0 refills | Status: AC
  Filled 2024-04-22: qty 5, 5d supply, fill #0

## 2024-04-22 NOTE — Patient Instructions (Signed)
  VISIT SUMMARY: During your visit, we discussed your allergic reaction to poly gel nails, which has worsened over time. You have been experiencing severe itching, redness, and discomfort, particularly at night, which has affected your sleep. We reviewed your current treatments and provided additional medications to help alleviate your symptoms.  YOUR PLAN: -ALLERGIC CONTACT DERMATITIS OF THE HANDS: Allergic contact dermatitis is a skin reaction that occurs when you come into contact with a substance that causes an allergic response. In your case, the poly gel nails have caused this reaction. We have prescribed triamcinolone cream to apply to your palms and finger pads, and oral prednisone to help with systemic relief. You should continue taking Claritin  for itch relief. It is important to remove the poly gel nails immediately and avoid using them in the future to prevent further reactions.  INSTRUCTIONS: Please follow the prescribed treatment plan and avoid using poly gel nails in the future. If your symptoms do not improve or worsen, schedule a follow-up appointment.                      Contains text generated by Abridge.                                 Contains text generated by Abridge.

## 2024-04-22 NOTE — Progress Notes (Signed)
 Patient ID: [name], female    DOB: 1975/08/13  MRN: [id number]  CC: Allergic Reaction (Poss allergic reaction to poly gell - Red, itching peeling, rash X3 days on fingers & abdomen /Already received flu vax. )   Subjective: [name] is a 48 y.o. female who presents for chronic ds management. NP Ronal Caldron Placey is with me. Her concerns today include:    Discussed the use of AI scribe software for clinical note transcription with the patient, who gave verbal consent to proceed.  History of Present Illness [name] is a 48 year old female who presents with an allergic reaction to poly gel nails. She is accompanied by her daughter.  She experienced an allergic reaction after applying poly gel nails four days ago. The itching began the same night, starting in her palms and moving around the nail beds and pads of her fingers. The itching is severe, with associated redness and a bruised appearance of the finger pads. This is her fourth time using the product, with previous applications resulting in mild itchiness, but this time the reaction is significantly more intense.  She has been using betamethasone cream since last night, provided by her daughter's friend, which has helped alleviate some of the itchiness. However, she continues to experience significant discomfort, particularly at night, which has affected her sleep for the past three nights. She also reports pain when touching objects and during hot showers, describing the sensation as 'raw skin'.  She has taken Claritin  to help with the symptoms, but the relief has been minimal. She reports that the reaction has been more severe with each subsequent application of the poly gel nails. Peeling of the skin is not reported.    Patient Active Problem List   Diagnosis Date Noted   Chronic cough 03/13/2023   Polyarthralgia 03/13/2023   Chronic bilateral low back pain without sciatica 03/13/2023   Chronic pain of both hips  03/13/2023   Dysmenorrhea 11/12/2021   Menorrhagia with regular cycle 11/12/2021   SVD (spontaneous vaginal delivery) 04/13/2020   Supervision of low-risk pregnancy, third trimester 04/11/2020   Indication for care in labor and delivery, antepartum 04/11/2020     Current Outpatient Medications on File Prior to Visit  Medication Sig Dispense Refill   acetaminophen  (TYLENOL ) 325 MG tablet Take 2 tablets (650 mg total) by mouth every 4 (four) hours as needed (for pain scale < 4). 30 tablet 0   cyclobenzaprine (FLEXERIL) 10 MG tablet Take 1 tablet (10 mg total) by mouth at bedtime as needed for muscle spasms. 10 tablet 0   estradiol  (CLIMARA ) 0.0375 mg/24hr patch Place 1 patch (0.0375 mg total) onto the skin once a week. 4 patch 1   fluticasone  (FLONASE ) 50 MCG/ACT nasal spray Place 1 spray into both nostrils daily. 16 g 0   hydrOXYzine  (VISTARIL ) 25 MG capsule Take 1-2 capsules (25-50 mg total) by mouth daily as needed. 60 capsule 1   loratadine  (CLARITIN ) 10 MG tablet Take 1 tablet (10 mg total) by mouth daily. 90 tablet 1   progesterone  (PROMETRIUM ) 100 MG capsule Take 1 capsule (100 mg total) by mouth at bedtime. 30 capsule 1   No current facility-administered medications on file prior to visit.    No Known Allergies  Social History   Socioeconomic History   Marital status: Legally Separated    Spouse name: Not on file   Number of children: 3   Years of education: Not on file  Highest education level: 12th grade  Occupational History   Occupation: Self employed - land  Tobacco Use   Smoking status: Never   Smokeless tobacco: Never  Vaping Use   Vaping status: Never Used  Substance and Sexual Activity   Alcohol use: Not Currently   Drug use: Not Currently   Sexual activity: Yes    Partners: Male    Birth control/protection: None  Other Topics Concern   Not on file  Social History Narrative   Not on file   Social Drivers of Health   Financial Resource  Strain: Low Risk  (03/13/2023)   Overall Financial Resource Strain (CARDIA)    Difficulty of Paying Living Expenses: Not very hard  Food Insecurity: No Food Insecurity (03/13/2023)   Hunger Vital Sign    Worried About Running Out of Food in the Last Year: Never true    Ran Out of Food in the Last Year: Never true  Transportation Needs: No Transportation Needs (03/13/2023)   PRAPARE - Administrator, Civil Service (Medical): No    Lack of Transportation (Non-Medical): No  Physical Activity: Inactive (03/13/2023)   Exercise Vital Sign    Days of Exercise per Week: 0 days    Minutes of Exercise per Session: 0 min  Stress: No Stress Concern Present (03/13/2023)   [employer] of Occupational Health - Occupational Stress Questionnaire    Feeling of Stress : Not at all  Social Connections: Socially Isolated (03/13/2023)   Social Connection and Isolation Panel    Frequency of Communication with Friends and Family: Once a week    Frequency of Social Gatherings with Friends and Family: More than three times a week    Attends Religious Services: Never    Database Administrator or Organizations: No    Attends Banker Meetings: Never    Marital Status: Separated  Intimate Partner Violence: Not At Risk (03/13/2023)   Humiliation, Afraid, Rape, and Kick questionnaire    Fear of Current or Ex-Partner: No    Emotionally Abused: No    Physically Abused: No    Sexually Abused: No    Family History  Problem Relation Age of Onset   Hyperthyroidism Mother    Colon cancer Mother    Diabetes Maternal Grandmother    Diabetes Paternal Grandfather    Breast cancer Neg Hx     Past Surgical History:  Procedure Laterality Date   CHOLECYSTECTOMY N/A 10/27/2018   Procedure: LAPAROSCOPIC CHOLECYSTECTOMY;  Surgeon: Vanderbilt Ned, MD;  Location: [facility];  Service: General;  Laterality: N/A;   WISDOM TOOTH EXTRACTION      ROS: Review of Systems Negative except as stated  above  PHYSICAL EXAM: BP 97/65 (BP Location: Left Arm, Patient Position: Sitting, Cuff Size: Normal)   Pulse (!) 103   Temp 98 F (36.7 C) (Oral)   Ht 5' (1.524 m)   Wt 172 lb (78 kg)   SpO2 98%   BMI 33.59 kg/m   Physical Exam  General appearance - alert, well appearing, and in no distress Mental status - normal mood, behavior, speech, dress, motor activity, and thought processes Skin - Hands: pt has on artificial nails. Mild to moderate redness and peeling of skin around the nail beds and finger pads. No blistering      Latest Ref Rng & Units 09/06/2022   11:06 PM 11/12/2021    2:55 PM 10/26/2018    9:16 AM  CMP  Glucose 70 - 99  mg/dL 877  90  80   BUN 6 - 20 mg/dL 14  25  11    Creatinine 0.44 - 1.00 mg/dL 9.38  9.39  9.40   Sodium 135 - 145 mmol/L 137  140  141   Potassium 3.5 - 5.1 mmol/L 4.0  4.4  4.8   Chloride 98 - 111 mmol/L 107  103  111   CO2 22 - 32 mmol/L 22  23  23    Calcium 8.9 - 10.3 mg/dL 8.8  9.4  9.1   Total Protein 6.5 - 8.1 g/dL 5.7  6.8  6.3   Total Bilirubin 0.3 - 1.2 mg/dL 0.2  0.3  1.0   Alkaline Phos 38 - 126 U/L 38  62  36   AST 15 - 41 U/L 20  42  30   ALT 0 - 44 U/L 23  30  18     Lipid Panel     Component Value Date/Time   CHOL 182 11/12/2021 1455   TRIG 184 (H) 11/12/2021 1455   HDL 52 11/12/2021 1455   CHOLHDL 3.5 11/12/2021 1455   LDLCALC 98 11/12/2021 1455    CBC    Component Value Date/Time   WBC 6.1 09/06/2022 2306   RBC 3.78 (L) 09/06/2022 2306   HGB 11.2 (L) 09/06/2022 2306   HGB 12.6 11/12/2021 1455   HCT 34.6 (L) 09/06/2022 2306   HCT 37.6 11/12/2021 1455   PLT 261 09/06/2022 2306   PLT 306 11/12/2021 1455   MCV 91.5 09/06/2022 2306   MCV 90 11/12/2021 1455   MCH 29.6 09/06/2022 2306   MCHC 32.4 09/06/2022 2306   RDW 12.6 09/06/2022 2306   RDW 13.0 11/12/2021 1455   LYMPHSABS 1.8 10/26/2018 0916   MONOABS 0.6 10/26/2018 0916   EOSABS 0.1 10/26/2018 0916   BASOSABS 0.0 10/26/2018 0916    ASSESSMENT AND  PLAN: Assessment & Plan Allergic contact dermatitis of the hands Acute allergic contact dermatitis due to poly gel nails, with worsening symptoms indicating cumulative reaction. Betamethasone provided partial relief. - Prescribed triamcinolone cream for palms and finger pads. - Prescribed oral prednisone for systemic relief. - Advised Claritin  for itch relief. - Recommended removal of poly gel nails immediately. - Instructed to avoid future use of poly gel nails.   Patient was given the opportunity to ask questions.  Patient verbalized understanding of the plan and was able to repeat key elements of the plan.   This documentation was completed using Paediatric nurse.  Any transcriptional errors are unintentional.  No orders of the defined types were placed in this encounter.    Requested Prescriptions   Pending Prescriptions Disp Refills   progesterone  (PROMETRIUM ) 100 MG capsule 30 capsule 1    Sig: Take 1 capsule (100 mg total) by mouth at bedtime.    No follow-ups on file.  Barnie Louder, MD, FACP

## 2024-04-27 ENCOUNTER — Other Ambulatory Visit: Payer: Self-pay | Admitting: Nurse Practitioner

## 2024-04-27 DIAGNOSIS — F419 Anxiety disorder, unspecified: Secondary | ICD-10-CM

## 2024-05-21 ENCOUNTER — Other Ambulatory Visit: Payer: Self-pay

## 2024-05-21 DIAGNOSIS — N951 Menopausal and female climacteric states: Secondary | ICD-10-CM

## 2024-05-21 DIAGNOSIS — Z79890 Hormone replacement therapy (postmenopausal): Secondary | ICD-10-CM

## 2024-05-21 MED ORDER — ESTRADIOL 0.0375 MG/24HR TD PTWK: 0.0375 mg | MEDICATED_PATCH | TRANSDERMAL | 1 refills | Status: AC

## 2024-05-21 NOTE — Telephone Encounter (Signed)
 Med refill request:   estradiol  (CLIMARA ) 0.0375 mg/24hr patch  Start:  03/29/24 Disp:   4 patches Refills:  1  Last OV:  03/18/24 Next AEX:  Not yet scheduled Last MMG (if hormonal med):  03/17/24 Refill authorized? Please Advise.

## 2024-05-25 ENCOUNTER — Other Ambulatory Visit: Payer: Self-pay

## 2024-05-25 DIAGNOSIS — N951 Menopausal and female climacteric states: Secondary | ICD-10-CM

## 2024-05-25 DIAGNOSIS — Z79890 Hormone replacement therapy (postmenopausal): Secondary | ICD-10-CM

## 2024-05-25 NOTE — Telephone Encounter (Signed)
 Med refill request:    estradiol  (CLIMARA ) 0.0375 mg/24hr patch  Disp:   4 patches Refills:  1  Last OV:  03/18/24 Next AEX:  Not yet scheduled Last MMG (if hormonal med):  03/17/24 Refill authorized? Please Advise.

## 2024-05-26 ENCOUNTER — Other Ambulatory Visit: Payer: Self-pay

## 2024-05-26 DIAGNOSIS — Z79890 Hormone replacement therapy (postmenopausal): Secondary | ICD-10-CM

## 2024-05-26 DIAGNOSIS — N951 Menopausal and female climacteric states: Secondary | ICD-10-CM

## 2024-05-26 MED ORDER — PROGESTERONE MICRONIZED 100 MG PO CAPS: 100.0000 mg | ORAL_CAPSULE | Freq: Every evening | ORAL | 0 refills | Status: AC

## 2024-05-26 NOTE — Telephone Encounter (Signed)
 Med refill request: progesterone  100 mg [facility] Last OV: 03/18/24 Last AEX:  none Next AEX: none scheduled Last MMG (if hormonal med) 03/17/24 BI-RADS 1 negative Refill sent to provider for approval or denial.

## 2024-06-22 ENCOUNTER — Other Ambulatory Visit: Payer: Self-pay | Admitting: Nurse Practitioner

## 2024-06-22 DIAGNOSIS — F419 Anxiety disorder, unspecified: Secondary | ICD-10-CM

## 2024-08-12 ENCOUNTER — Ambulatory Visit: Admitting: Nurse Practitioner
